# Patient Record
Sex: Female | Born: 1975 | Race: Black or African American | Hispanic: No | Marital: Single | Smoking: Never smoker
Health system: Southern US, Community
[De-identification: ages and names within clinical notes are randomized; demographics above are authoritative.]

## PROBLEM LIST (undated history)

## (undated) DIAGNOSIS — E119 Type 2 diabetes mellitus without complications: Secondary | ICD-10-CM

## (undated) DIAGNOSIS — F32A Depression, unspecified: Secondary | ICD-10-CM

## (undated) DIAGNOSIS — I10 Essential (primary) hypertension: Secondary | ICD-10-CM

## (undated) DIAGNOSIS — E669 Obesity, unspecified: Secondary | ICD-10-CM

## (undated) DIAGNOSIS — F79 Unspecified intellectual disabilities: Secondary | ICD-10-CM

## (undated) DIAGNOSIS — E0500 Thyrotoxicosis with diffuse goiter without thyrotoxic crisis or storm: Secondary | ICD-10-CM

## (undated) DIAGNOSIS — K7581 Nonalcoholic steatohepatitis (NASH): Secondary | ICD-10-CM

## (undated) DIAGNOSIS — J45909 Unspecified asthma, uncomplicated: Secondary | ICD-10-CM

## (undated) HISTORY — DX: Obesity, unspecified: E66.9

## (undated) HISTORY — DX: Type 2 diabetes mellitus without complications: E11.9

## (undated) HISTORY — DX: Nonalcoholic steatohepatitis (NASH): K75.81

## (undated) HISTORY — DX: Unspecified intellectual disabilities: F79

## (undated) HISTORY — DX: Unspecified asthma, uncomplicated: J45.909

## (undated) HISTORY — DX: Essential (primary) hypertension: I10

## (undated) HISTORY — DX: Thyrotoxicosis with diffuse goiter without thyrotoxic crisis or storm: E05.00

## (undated) HISTORY — DX: Depression, unspecified: F32.A

---

## 2015-05-16 DIAGNOSIS — E119 Type 2 diabetes mellitus without complications: Secondary | ICD-10-CM | POA: Insufficient documentation

## 2015-05-16 DIAGNOSIS — I10 Essential (primary) hypertension: Secondary | ICD-10-CM | POA: Insufficient documentation

## 2015-07-24 DIAGNOSIS — K760 Fatty (change of) liver, not elsewhere classified: Secondary | ICD-10-CM | POA: Insufficient documentation

## 2016-06-08 DIAGNOSIS — E7800 Pure hypercholesterolemia, unspecified: Secondary | ICD-10-CM | POA: Insufficient documentation

## 2017-12-06 DIAGNOSIS — E1165 Type 2 diabetes mellitus with hyperglycemia: Secondary | ICD-10-CM | POA: Insufficient documentation

## 2018-08-27 DIAGNOSIS — E0590 Thyrotoxicosis, unspecified without thyrotoxic crisis or storm: Secondary | ICD-10-CM | POA: Insufficient documentation

## 2018-08-31 ENCOUNTER — Other Ambulatory Visit (HOSPITAL_COMMUNITY): Payer: Self-pay | Admitting: Family Medicine

## 2018-08-31 ENCOUNTER — Other Ambulatory Visit: Payer: Self-pay | Admitting: Family Medicine

## 2018-08-31 DIAGNOSIS — E119 Type 2 diabetes mellitus without complications: Secondary | ICD-10-CM

## 2018-08-31 DIAGNOSIS — R109 Unspecified abdominal pain: Secondary | ICD-10-CM

## 2018-09-01 ENCOUNTER — Other Ambulatory Visit: Payer: Self-pay | Admitting: Family Medicine

## 2018-09-01 DIAGNOSIS — R109 Unspecified abdominal pain: Secondary | ICD-10-CM

## 2018-09-01 DIAGNOSIS — E119 Type 2 diabetes mellitus without complications: Secondary | ICD-10-CM

## 2019-12-25 ENCOUNTER — Other Ambulatory Visit: Payer: Self-pay | Admitting: Family Medicine

## 2019-12-25 DIAGNOSIS — Z1231 Encounter for screening mammogram for malignant neoplasm of breast: Secondary | ICD-10-CM

## 2020-01-03 ENCOUNTER — Encounter: Payer: Self-pay | Admitting: Radiology

## 2020-01-03 ENCOUNTER — Ambulatory Visit
Admission: RE | Admit: 2020-01-03 | Discharge: 2020-01-03 | Disposition: A | Discharge status: Home / Self Care | Payer: Medicare Other | Source: Ambulatory Visit | Attending: Family Medicine | Admitting: Family Medicine

## 2020-01-03 DIAGNOSIS — Z1231 Encounter for screening mammogram for malignant neoplasm of breast: Secondary | ICD-10-CM | POA: Diagnosis not present

## 2020-01-30 ENCOUNTER — Emergency Department: Payer: Medicare Other

## 2020-01-30 ENCOUNTER — Encounter: Payer: Self-pay | Admitting: *Deleted

## 2020-01-30 ENCOUNTER — Emergency Department
Admission: EM | Admit: 2020-01-30 | Discharge: 2020-01-30 | Discharge status: Against Medical Advice | Payer: Medicare Other | Attending: Emergency Medicine | Admitting: Emergency Medicine

## 2020-01-30 ENCOUNTER — Other Ambulatory Visit: Payer: Self-pay

## 2020-01-30 DIAGNOSIS — R0789 Other chest pain: Secondary | ICD-10-CM | POA: Insufficient documentation

## 2020-01-30 DIAGNOSIS — Z5321 Procedure and treatment not carried out due to patient leaving prior to being seen by health care provider: Secondary | ICD-10-CM | POA: Diagnosis not present

## 2020-01-30 LAB — CBC
HCT: 41.6 % (ref 36.0–46.0)
Hemoglobin: 13.7 g/dL (ref 12.0–15.0)
MCH: 28.2 pg (ref 26.0–34.0)
MCHC: 32.9 g/dL (ref 30.0–36.0)
MCV: 85.6 fL (ref 80.0–100.0)
Platelets: 352 10*3/uL (ref 150–400)
RBC: 4.86 MIL/uL (ref 3.87–5.11)
RDW: 13.7 % (ref 11.5–15.5)
WBC: 10.5 10*3/uL (ref 4.0–10.5)
nRBC: 0 % (ref 0.0–0.2)

## 2020-01-30 LAB — BASIC METABOLIC PANEL
Anion gap: 7 (ref 5–15)
BUN: 10 mg/dL (ref 6–20)
CO2: 27 mmol/L (ref 22–32)
Calcium: 9.3 mg/dL (ref 8.9–10.3)
Chloride: 103 mmol/L (ref 98–111)
Creatinine, Ser: 0.87 mg/dL (ref 0.44–1.00)
GFR calc Af Amer: >60 mL/min (ref >60)
GFR calc non Af Amer: >60 mL/min (ref >60)
Glucose, Bld: 197 mg/dL — ABNORMAL HIGH (ref 70–99)
Potassium: 4.8 mmol/L (ref 3.5–5.1)
Sodium: 137 mmol/L (ref 135–145)

## 2020-01-30 LAB — TROPONIN I (HIGH SENSITIVITY): Troponin I (High Sensitivity): 3 ng/L (ref <18)

## 2020-01-30 MED ORDER — SODIUM CHLORIDE 0.9% FLUSH: 3.0000 mL | Freq: Once | INTRAVENOUS | Status: DC

## 2020-01-30 NOTE — ED Triage Notes (Signed)
Pt to triage via wheelchair.  Pt has chest pain and sob.  Sx began this am.  Pain in center of chest.  Hx asthma.  Pt alert  Speech clear.

## 2020-05-01 ENCOUNTER — Encounter: Payer: Self-pay | Admitting: General Surgery

## 2020-05-01 ENCOUNTER — Other Ambulatory Visit: Payer: Self-pay | Admitting: General Surgery

## 2020-05-01 ENCOUNTER — Other Ambulatory Visit: Payer: Self-pay

## 2020-05-01 ENCOUNTER — Ambulatory Visit (INDEPENDENT_AMBULATORY_CARE_PROVIDER_SITE_OTHER): Payer: Medicare Other | Admitting: General Surgery

## 2020-05-01 VITALS — BP 159/96 | HR 85 | Temp 99.0°F | Ht 67.0 in | Wt 217.8 lb

## 2020-05-01 DIAGNOSIS — E0500 Thyrotoxicosis with diffuse goiter without thyrotoxic crisis or storm: Secondary | ICD-10-CM | POA: Diagnosis not present

## 2020-05-01 NOTE — Patient Instructions (Addendum)
Our surgery scheduler Pamala Hurry will contact you within the next 24-48 hours. During that call, she will discuss the preparation prior to surgery and discuss the different dates and times to get scheduled. Please have the BLUE sheet available when she contacts you. If you have any questions regarding surgery, please do not hesitate to give our office a call. Dr.Cannon discussed the surgery and risk factors at today's visit with patient and family. Dr.Cannon recommends patient to take Calcium supplements daily for two weeks prior to surgery. Dr.Cannon recommends taking 2 Tums three times a day.  Hyperthyroidism  Hyperthyroidism is when the thyroid gland is too active (overactive). The thyroid gland is a small gland located in the lower front part of the neck, just in front of the windpipe (trachea). This gland makes hormones that help control how the body uses food for energy (metabolism) as well as how the heart and brain function. These hormones also play a role in keeping your bones strong. When the thyroid is overactive, it produces too much of a hormone called thyroxine. What are the causes? This condition may be caused by:  Graves' disease. This is a disorder in which the body's disease-fighting system (immune system) attacks the thyroid gland. This is the most common cause.  Inflammation of the thyroid gland.  A tumor in the thyroid gland.  Use of certain medicines, including: ? Prescription thyroid hormone replacement. ? Herbal supplements that mimic thyroid hormones. ? Amiodarone therapy.  Solid or fluid-filled lumps within your thyroid gland (thyroid nodules).  Taking in a large amount of iodine from foods or medicines. What increases the risk? You are more likely to develop this condition if:  You are female.  You have a family history of thyroid conditions.  You smoke tobacco.  You use a medicine called lithium.  You take medicines that affect the immune system  (immunosuppressants). What are the signs or symptoms? Symptoms of this condition include:  Nervousness.  Inability to tolerate heat.  Unexplained weight loss.  Diarrhea.  Change in the texture of hair or skin.  Heart skipping beats or making extra beats.  Rapid heart rate.  Loss of menstruation.  Shaky hands.  Fatigue.  Restlessness.  Sleep problems.  Enlarged thyroid gland or a lump in the thyroid (nodule). You may also have symptoms of Graves' disease, which may include:  Protruding eyes.  Dry eyes.  Red or swollen eyes.  Problems with vision. How is this diagnosed? This condition may be diagnosed based on:  Your symptoms and medical history.  A physical exam.  Blood tests.  Thyroid ultrasound. This test involves using sound waves to produce images of the thyroid gland.  A thyroid scan. A radioactive substance is injected into a vein, and images show how much iodine is present in the thyroid.  Radioactive iodine uptake test (RAIU). A small amount of radioactive iodine is given by mouth to see how much iodine the thyroid absorbs after a certain amount of time. How is this treated? Treatment depends on the cause and severity of the condition. Treatment may include:  Medicines to reduce the amount of thyroid hormone your body makes.  Radioactive iodine treatment (radioiodine therapy). This involves swallowing a small dose of radioactive iodine, in capsule or liquid form, to kill thyroid cells.  Surgery to remove part or all of your thyroid gland. You may need to take thyroid hormone replacement medicine for the rest of your life after thyroid surgery.  Medicines to help manage your symptoms.  Follow these instructions at home:   Take over-the-counter and prescription medicines only as told by your health care provider.  Do not use any products that contain nicotine or tobacco, such as cigarettes and e-cigarettes. If you need help quitting, ask your  health care provider.  Follow any instructions from your health care provider about diet. You may be instructed to limit foods that contain iodine.  Keep all follow-up visits as told by your health care provider. This is important. ? You will need to have blood tests regularly so that your health care provider can monitor your condition. Contact a health care provider if:  Your symptoms do not get better with treatment.  You have a fever.  You are taking thyroid hormone replacement medicine and you: ? Have symptoms of depression. ? Feel like you are tired all the time. ? Gain weight. Get help right away if:  You have chest pain.  You have decreased alertness or a change in your awareness.  You have abdominal pain.  You feel dizzy.  You have a rapid heartbeat.  You have an irregular heartbeat.  You have difficulty breathing. Summary  The thyroid gland is a small gland located in the lower front part of the neck, just in front of the windpipe (trachea).  Hyperthyroidism is when the thyroid gland is too active (overactive) and produces too much of a hormone called thyroxine.  The most common cause is Graves' disease, a disorder in which your immune system attacks the thyroid gland.  Hyperthyroidism can cause various symptoms, such as unexplained weight loss, nervousness, inability to tolerate heat, or changes in your heartbeat.  Treatment may include medicine to reduce the amount of thyroid hormone your body makes, radioiodine therapy, surgery, or medicines to manage symptoms. This information is not intended to replace advice given to you by your health care provider. Make sure you discuss any questions you have with your health care provider. Document Revised: 07/22/2017 Document Reviewed: 07/20/2017 Elsevier Patient Education  2020 [employer].

## 2020-05-01 NOTE — Progress Notes (Signed)
Patient ID: Jordan Sutton, female   DOB: Oct 30, 1975, 44 y.o.   MRN: [id number]  Chief Complaint  Patient presents with  . New Patient (Initial Visit)    new pt ref Dr.Melissa Solum eval for total thyroidectomy    HPI Jordan Sutton is a 44 y.o. female.   She has been referred by Dr. Gabriel Carina for surgical evaluation of Graves' disease. Dr. Gabriel Carina has followed her since January 2020. Onset of disease was April 2019. She was initiated on methimazole, but she has not been consistently compliant with this medication when she is not staying with her aunt, Jordan Sutton, who helps with her medication management and makes her medical decisions (secondary to intellectual disability). Due to some difficulty with medical compliance, the option of iodine-131 was discussed with the patient and Jordan Sutton. Due to the post ablation sequestration requirements, this is not feasible in their current living situation. As result, surgery has been recommended as the best means of managing Jordan Sutton's Graves' disease.  Ms. Jordan Sutton denies any anxiousness, jitteriness, or hand tremors. She does endorse heart palpitations. She denies any changes in the texture of her hair, skin, or fingernails. No heat or cold intolerance. She denies hyper defecation. No significant fatigue or excess energy. No voice changes or dysphagia. She does endorse frequent throat clearing. She reports photosensitivity, a gritty sensation in her eyes, and frequent watering. Jordan Sutton reports that when she was first diagnosed, Jordan Sutton was losing weight, but has since gained over 60 pounds. Based on Dr. Joycie Sutton notes, this appears to be primarily secondary to poor dietary habits. Current methimazole dose is 20 mg twice daily. Her most recent labs (April 16, 2020) show a TSH that is undetectable, a free T4 of 1.18 ng/dL. Alkaline phosphatase is mildly elevated, but AST and ALT are within normal limits. Her hemogram appears normal.   Past Medical History:  Diagnosis Date  .  Asthma   . Depression   . Diabetes mellitus without complication ([place])   . Graves' disease with exophthalmos   . Hypertension   . Intellectual disability   . NASH (nonalcoholic steatohepatitis)   . Obesity     History reviewed. No pertinent surgical history.  Patient states she has never had surgery.  Family History  Problem Relation Age of Onset  . Alcohol abuse Mother   . Hypertension Father   No known family history of Graves' disease or other autoimmune disorders/thyroid problems.  Social History Social History   Tobacco Use  . Smoking status: Never Smoker  . Smokeless tobacco: Never Used  Substance Use Topics  . Alcohol use: Not Currently  . Drug use: Not on file    No Known Allergies  Current Outpatient Medications  Medication Sig Dispense Refill  . albuterol (VENTOLIN HFA) 108 (90 Base) MCG/ACT inhaler Inhale into the lungs.    Marland Kitchen amphetamine-dextroamphetamine (ADDERALL XR) 20 MG 24 hr capsule     . Blood Glucose Monitoring Suppl (FIFTY50 GLUCOSE METER 2.0) w/Device KIT Use as directed E11.9    . Cysteamine Bitartrate (PROCYSBI) 300 MG PACK Use 1 each once daily    . gabapentin (NEURONTIN) 100 MG capsule     . glipiZIDE (GLUCOTROL XL) 5 MG 24 hr tablet Take by mouth.    Marland Kitchen glucose blood (PRECISION QID TEST) test strip Use 1 each (1 strip total) once daily Use as instructed. E11.9    . haloperidol (HALDOL) 2 MG tablet Take by mouth.    Marland Kitchen ipratropium (ATROVENT HFA) 17 MCG/ACT inhaler Inhale into the lungs.    Marland Kitchen  metFORMIN (GLUCOPHAGE) 1000 MG tablet Take 1 tablet (1,000 mg total) by mouth 2 (two) times daily with meals    . methimazole (TAPAZOLE) 10 MG tablet     . sertraline (ZOLOFT) 100 MG tablet Take by mouth.    . simvastatin (ZOCOR) 40 MG tablet     . Spacer/Aero-Holding Chambers (EASIVENT) inhaler See admin instructions.    . topiramate (TOPAMAX) 50 MG tablet Take by mouth.    Marland Kitchen VYVANSE 30 MG capsule      No current facility-administered medications for this  visit.    Review of Systems Review of Systems  Eyes: Positive for photophobia, pain, discharge, redness and itching.  Cardiovascular: Positive for palpitations.  Skin:       Pruritus  All other systems reviewed and are negative. Or as discussed in the history of present illness.  Blood pressure (!) 159/96, pulse 85, temperature 99 F (37.2 C), temperature source Oral, height 5' 7"  (1.702 m), weight 217 lb 12.8 oz (98.8 kg), SpO2 98 %. Body mass index is 34.11 kg/m.  Physical Exam Physical Exam Constitutional:      General: She is not in acute distress.    Appearance: She is obese.  HENT:     Head: Normocephalic and atraumatic.     Nose:     Comments: Covered with a mask    Mouth/Throat:     Comments: Covered with a mask Eyes:     General: No scleral icterus.    Comments: Proptosis and lid lag are present. She has significant periorbital edema.  Neck:     Comments: The thyroid is diffusely enlarged. It is nontender. No discrete masses are palpated. The trachea is midline. The thyroid gland moves freely with deglutition. No thyroid bruit is identified. No palpable cervical or supraclavicular lymphadenopathy. Cardiovascular:     Rate and Rhythm: Normal rate and regular rhythm.     Pulses: Normal pulses.  Pulmonary:     Effort: Pulmonary effort is normal. No respiratory distress.     Breath sounds: Wheezing present. No rhonchi.  Abdominal:     General: Bowel sounds are normal.     Palpations: Abdomen is soft.  Genitourinary:    Comments: Deferred Musculoskeletal:        General: No deformity or signs of injury.     Right lower leg: No edema.     Left lower leg: No edema.     Comments: No pretibial dermopathy.  Skin:    General: Skin is warm and dry.  Neurological:     General: No focal deficit present.     Mental Status: She is alert. Mental status is at baseline.  Psychiatric:        Mood and Affect: Mood normal.        Behavior: Behavior normal.     Data  Reviewed As discussed in the history of present illness, I have reviewed Dr. Joycie Sutton clinic notes regarding this patient. These include the most recent thyroid function tests as reported above.  There are no relevant imaging studies for review.  Assessment This is a 44 year old woman with intellectual disability and Graves' disease. She has had some difficulty maintaining medical compliance with her methimazole. She and her aunt, with whom she lives and who makes her medical decisions for her, wish to pursue more permanent treatment of her hyperthyroidism. Due to their living situation, post ablative sequestration is not possible and therefore they wish to proceed with total thyroidectomy.  Plan The risks of thyroid  surgery were discussed, including (but not limited to): bleeding, infection, damage to surrounding structures/tissues, injury (temporary or permanent) to the recurrent laryngeal nerve, hypoparathyroidism (temporary or permanent), need for thyroid hormone replacement therapy, need for additional surgery and/or treatment, recurrence of disease, tracheostomy (temporary or permanent).  The patient and her aunt had the opportunity to ask any questions and these were answered to their satisfaction.  At this time, her current free T4 is within a safe and acceptable range for surgical intervention. They were encouraged to continue strict adherence to medical compliance to make sure that we do not run into issues with symptomatic hyperthyroidism or thyroid storm around the time of her surgery. 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 This has been demonstrated to be ameliorated to a significant degree by preoperative calcium supplementation starting 10 days to 2 weeks prior to surgery. We will have the patient take 2 Tums 3 times daily starting 2 weeks prior to her operation. I do not think she requires any SSKI or other preemptory medications. We will work  on getting her scheduled.    Fredirick Maudlin 05/01/2020, 1:11 PM

## 2020-05-05 ENCOUNTER — Telehealth: Payer: Self-pay | Admitting: General Surgery

## 2020-05-05 NOTE — Telephone Encounter (Signed)
Spoke with aunt, [name] (caretaker for [facility]).  She has been advised of Pre-Admission date/time, COVID Testing date and Surgery date.  Surgery Date: 06/23/20 Preadmission Testing Date: 06/16/20 (phone 8a-1p) Covid Testing Date: 06/19/20 - patient advised to go to the [facility] between 8a-1p   They also have been made aware to call [phone], between 1-3:00pm the day before surgery, to find out what time to arrive for surgery.

## 2020-05-06 ENCOUNTER — Inpatient Hospital Stay: Admission: RE | Admit: 2020-05-06 | Payer: Medicare Other | Source: Ambulatory Visit

## 2020-05-08 ENCOUNTER — Other Ambulatory Visit: Payer: Medicare Other

## 2020-06-16 ENCOUNTER — Other Ambulatory Visit: Payer: Self-pay

## 2020-06-16 ENCOUNTER — Encounter
Admission: RE | Admit: 2020-06-16 | Discharge: 2020-06-16 | Disposition: A | Discharge status: Home / Self Care | Payer: Medicare Other | Source: Ambulatory Visit | Attending: General Surgery | Admitting: General Surgery

## 2020-06-16 NOTE — Patient Instructions (Addendum)
Your procedure is scheduled on: 06/23/20 Report to [facility]. To find out your arrival time please call [phone] between 1PM - 3PM on 06/20/20.  Remember: Instructions that are not followed completely may result in serious medical risk, up to and including death, or upon the discretion of your surgeon and anesthesiologist your surgery may need to be rescheduled.     _X__ 1. Do not eat food after midnight the night before your procedure.                 No gum chewing or hard candies. You may drink clear liquids up to 2 hours                 before you are scheduled to arrive for your surgery- DO not drink clear                 liquids within 2 hours of the start of your surgery.                 Clear Liquids include:  water, apple juice without pulp, clear carbohydrate                 drink such as Clearfast or Gatorade, Black Coffee or Tea (Do not add                 anything to coffee or tea). Diabetics water only  __X__2.  On the morning of surgery brush your teeth with toothpaste and water, you                 may rinse your mouth with mouthwash if you wish.  Do not swallow any              toothpaste of mouthwash.     _X__ 3.  No Alcohol for 24 hours before or after surgery.   _X__ 4.  Do Not Smoke or use e-cigarettes For 24 Hours Prior to Your Surgery.                 Do not use any chewable tobacco products for at least 6 hours prior to                 surgery.  ____  5.  Bring all medications with you on the day of surgery if instructed.   __X__  6.  Notify your doctor if there is any change in your medical condition      (cold, fever, infections).     Do not wear jewelry, make-up, hairpins, clips or nail polish. Do not wear lotions, powders, or perfumes.  Do not shave 48 hours prior to surgery. Men may shave face and neck. Do not bring valuables to the hospital.    [facility] is not responsible for any belongings  or valuables.  Contacts, dentures/partials or body piercings may not be worn into surgery. Bring a case for your contacts, glasses or hearing aids, a denture cup will be supplied. Leave your suitcase in the car. After surgery it may be brought to your room. For patients admitted to the hospital, discharge time is determined by your treatment team.   Patients discharged the day of surgery will not be allowed to drive home.   Please read over the following fact sheets that you were given:   MRSA Information  __X__ Take these medicines the morning of surgery with A SIP OF WATER:  1. haloperidol (HALDOL) 2 MG tablet  2. methimazole (TAPAZOLE) 10 MG tablet  3. sertraline (ZOLOFT) 100 MG tablet  4. simvastatin (ZOCOR) 40 MG tablet  5. topiramate (TOPAMAX) 50 MG tablet  6. VYVANSE 50 MG capsule  ____ Fleet Enema (as directed)   __X__ Use CHG Soap/SAGE wipes as directed  __X__ Use inhalers on the day of surgery  __X__ Stop metformin/Janumet/Farxiga 2 days prior to surgery    ____ Take 1/2 of usual insulin dose the night before surgery. No insulin the morning          of surgery.   ____ Stop Blood Thinners Coumadin/Plavix/Xarelto/Pleta/Pradaxa/Eliquis/Effient/Aspirin  on   Or contact your Surgeon, Cardiologist or Medical Doctor regarding  ability to stop your blood thinners  __X__ Stop Anti-inflammatories 7 days before surgery such as Advil, Ibuprofen, Motrin,  BC or Goodies Powder, Naprosyn, Naproxen, Aleve, Aspirin    __X__ Stop all herbal supplements, fish oil or vitamin E until after surgery.    ____ Bring C-Pap to the hospital.

## 2020-06-19 ENCOUNTER — Other Ambulatory Visit
Admission: RE | Admit: 2020-06-19 | Discharge: 2020-06-19 | Disposition: A | Discharge status: Home / Self Care | Payer: Medicare Other | Source: Ambulatory Visit | Attending: General Surgery | Admitting: General Surgery

## 2020-06-19 ENCOUNTER — Other Ambulatory Visit: Payer: Medicare Other

## 2020-06-19 ENCOUNTER — Other Ambulatory Visit: Payer: Self-pay

## 2020-06-19 DIAGNOSIS — Z20822 Contact with and (suspected) exposure to covid-19: Secondary | ICD-10-CM | POA: Diagnosis not present

## 2020-06-19 DIAGNOSIS — Z01812 Encounter for preprocedural laboratory examination: Secondary | ICD-10-CM | POA: Diagnosis present

## 2020-06-19 LAB — BASIC METABOLIC PANEL
Anion gap: 8 (ref 5–15)
BUN: 9 mg/dL (ref 6–20)
CO2: 26 mmol/L (ref 22–32)
Calcium: 9.5 mg/dL (ref 8.9–10.3)
Chloride: 104 mmol/L (ref 98–111)
Creatinine, Ser: 0.74 mg/dL (ref 0.44–1.00)
GFR, Estimated: >60 mL/min (ref >60)
Glucose, Bld: 148 mg/dL — ABNORMAL HIGH (ref 70–99)
Potassium: 4.2 mmol/L (ref 3.5–5.1)
Sodium: 138 mmol/L (ref 135–145)

## 2020-06-19 LAB — SARS CORONAVIRUS 2 (TAT 6-24 HRS): SARS Coronavirus 2: NEGATIVE

## 2020-06-23 ENCOUNTER — Other Ambulatory Visit: Payer: Self-pay

## 2020-06-23 ENCOUNTER — Encounter
Admission: RE | Disposition: A | Discharge status: Home / Self Care | Payer: Self-pay | Source: Home / Self Care | Attending: General Surgery

## 2020-06-23 ENCOUNTER — Encounter: Payer: Self-pay | Admitting: General Surgery

## 2020-06-23 ENCOUNTER — Observation Stay
Admission: RE | Admit: 2020-06-23 | Discharge: 2020-06-24 | Disposition: A | Discharge status: Home / Self Care | Payer: Medicare Other | Attending: General Surgery | Admitting: General Surgery

## 2020-06-23 ENCOUNTER — Ambulatory Visit: Payer: Medicare Other

## 2020-06-23 DIAGNOSIS — Z79899 Other long term (current) drug therapy: Secondary | ICD-10-CM | POA: Insufficient documentation

## 2020-06-23 DIAGNOSIS — E890 Postprocedural hypothyroidism: Secondary | ICD-10-CM

## 2020-06-23 DIAGNOSIS — Z7984 Long term (current) use of oral hypoglycemic drugs: Secondary | ICD-10-CM | POA: Diagnosis not present

## 2020-06-23 DIAGNOSIS — Z9889 Other specified postprocedural states: Secondary | ICD-10-CM

## 2020-06-23 DIAGNOSIS — E0500 Thyrotoxicosis with diffuse goiter without thyrotoxic crisis or storm: Principal | ICD-10-CM

## 2020-06-23 DIAGNOSIS — I10 Essential (primary) hypertension: Secondary | ICD-10-CM | POA: Diagnosis not present

## 2020-06-23 DIAGNOSIS — J45909 Unspecified asthma, uncomplicated: Secondary | ICD-10-CM | POA: Diagnosis not present

## 2020-06-23 DIAGNOSIS — E119 Type 2 diabetes mellitus without complications: Secondary | ICD-10-CM | POA: Insufficient documentation

## 2020-06-23 HISTORY — PX: THYROIDECTOMY: SHX17

## 2020-06-23 LAB — GLUCOSE, CAPILLARY
Glucose-Capillary: 202 mg/dL — ABNORMAL HIGH (ref 70–99)
Glucose-Capillary: 188 mg/dL — ABNORMAL HIGH (ref 70–99)

## 2020-06-23 LAB — ALBUMIN: Albumin: 3.7 g/dL (ref 3.5–5.0)

## 2020-06-23 LAB — POCT PREGNANCY, URINE: Preg Test, Ur: NEGATIVE

## 2020-06-23 LAB — CALCIUM: Calcium: 8.7 mg/dL — ABNORMAL LOW (ref 8.9–10.3)

## 2020-06-23 SURGERY — THYROIDECTOMY
Anesthesia: General | Site: Neck

## 2020-06-23 MED ORDER — GABAPENTIN 100 MG PO CAPS
100.0000 mg | ORAL_CAPSULE | Freq: Every day | ORAL | Status: DC
  Administered 2020-06-23: 100 mg via ORAL

## 2020-06-23 MED ORDER — GLIPIZIDE ER 5 MG PO TB24
5.0000 mg | ORAL_TABLET | Freq: Every day | ORAL | Status: DC
  Administered 2020-06-24: 5 mg via ORAL
  Filled 2020-06-23 (×2): qty 1

## 2020-06-23 MED ORDER — ORAL CARE MOUTH RINSE: 15.0000 mL | Freq: Once | OROMUCOSAL | Status: AC

## 2020-06-23 MED ORDER — HEMOSTATIC AGENTS (NO CHARGE) OPTIME
TOPICAL | Status: DC | PRN
  Administered 2020-06-23: 1 via TOPICAL

## 2020-06-23 MED ORDER — DIPHENHYDRAMINE HCL 50 MG/ML IJ SOLN
INTRAMUSCULAR | Status: AC
  Filled 2020-06-23: qty 1

## 2020-06-23 MED ORDER — ALBUTEROL SULFATE HFA 108 (90 BASE) MCG/ACT IN AERS
1.0000 | INHALATION_SPRAY | Freq: Four times a day (QID) | RESPIRATORY_TRACT | Status: DC | PRN
  Filled 2020-06-23: qty 6.7

## 2020-06-23 MED ORDER — ACETAMINOPHEN 500 MG PO TABS
ORAL_TABLET | ORAL | Status: AC
  Administered 2020-06-23: 1000 mg via ORAL
  Filled 2020-06-23: qty 2

## 2020-06-23 MED ORDER — PROPOFOL 10 MG/ML IV BOLUS
INTRAVENOUS | Status: AC
  Filled 2020-06-23: qty 20

## 2020-06-23 MED ORDER — IPRATROPIUM BROMIDE HFA 17 MCG/ACT IN AERS
1.0000 | INHALATION_SPRAY | Freq: Four times a day (QID) | RESPIRATORY_TRACT | Status: DC | PRN
  Filled 2020-06-23: qty 12.9

## 2020-06-23 MED ORDER — ONDANSETRON HCL 4 MG/2ML IJ SOLN
INTRAMUSCULAR | Status: AC
  Filled 2020-06-23: qty 2

## 2020-06-23 MED ORDER — SODIUM CHLORIDE 0.9 % IV SOLN
INTRAVENOUS | Status: DC | PRN
  Administered 2020-06-23: 20 ug/min via INTRAVENOUS

## 2020-06-23 MED ORDER — "VISTASEAL 4 ML SINGLE DOSE KIT "
PACK | CUTANEOUS | Status: DC | PRN
  Administered 2020-06-23: 4 mL via TOPICAL

## 2020-06-23 MED ORDER — OXYCODONE HCL 5 MG/5ML PO SOLN: 5.0000 mg | Freq: Once | ORAL | Status: DC | PRN

## 2020-06-23 MED ORDER — AMPHETAMINE-DEXTROAMPHET ER 20 MG PO CP24: 20.0000 mg | ORAL_CAPSULE | Freq: Every day | ORAL | Status: DC

## 2020-06-23 MED ORDER — MIDAZOLAM HCL 2 MG/2ML IJ SOLN
INTRAMUSCULAR | Status: DC | PRN
  Administered 2020-06-23 (×2): 1 mg via INTRAVENOUS

## 2020-06-23 MED ORDER — GABAPENTIN 300 MG PO CAPS
ORAL_CAPSULE | ORAL | Status: AC
  Administered 2020-06-23: 300 mg via ORAL
  Filled 2020-06-23: qty 1

## 2020-06-23 MED ORDER — PROPOFOL 10 MG/ML IV BOLUS
INTRAVENOUS | Status: DC | PRN
  Administered 2020-06-23: 150 ug/kg/min via INTRAVENOUS

## 2020-06-23 MED ORDER — SODIUM CHLORIDE 0.9 % IV SOLN: INTRAVENOUS | Status: DC

## 2020-06-23 MED ORDER — PHENYLEPHRINE HCL (PRESSORS) 10 MG/ML IV SOLN
INTRAVENOUS | Status: DC | PRN
  Administered 2020-06-23: 100 ug via INTRAVENOUS

## 2020-06-23 MED ORDER — HALOPERIDOL 2 MG PO TABS
2.0000 mg | ORAL_TABLET | Freq: Every morning | ORAL | Status: DC
  Administered 2020-06-24: 2 mg via ORAL
  Filled 2020-06-23: qty 1

## 2020-06-23 MED ORDER — DEXAMETHASONE SODIUM PHOSPHATE 10 MG/ML IJ SOLN
INTRAMUSCULAR | Status: AC
  Filled 2020-06-23: qty 1

## 2020-06-23 MED ORDER — OXYCODONE HCL 5 MG PO TABS: 5.0000 mg | ORAL_TABLET | Freq: Once | ORAL | Status: DC | PRN

## 2020-06-23 MED ORDER — GABAPENTIN 300 MG PO CAPS: 300.0000 mg | ORAL_CAPSULE | ORAL | Status: AC

## 2020-06-23 MED ORDER — CHLORHEXIDINE GLUCONATE CLOTH 2 % EX PADS: 6.0000 | MEDICATED_PAD | Freq: Once | CUTANEOUS | Status: DC

## 2020-06-23 MED ORDER — LEVOTHYROXINE SODIUM 150 MCG PO TABS
150.0000 ug | ORAL_TABLET | Freq: Every day | ORAL | Status: DC
  Administered 2020-06-24: 150 ug via ORAL
  Filled 2020-06-23: qty 1

## 2020-06-23 MED ORDER — LIDOCAINE HCL (PF) 2 % IJ SOLN
INTRAMUSCULAR | Status: AC
  Filled 2020-06-23: qty 5

## 2020-06-23 MED ORDER — DEXMEDETOMIDINE (PRECEDEX) IN NS 20 MCG/5ML (4 MCG/ML) IV SYRINGE
PREFILLED_SYRINGE | INTRAVENOUS | Status: DC | PRN
  Administered 2020-06-23 (×5): 4 ug via INTRAVENOUS

## 2020-06-23 MED ORDER — TOPIRAMATE 25 MG PO TABS
50.0000 mg | ORAL_TABLET | Freq: Every day | ORAL | Status: DC
  Administered 2020-06-23 – 2020-06-24 (×2): 50 mg via ORAL
  Filled 2020-06-23 (×2): qty 2

## 2020-06-23 MED ORDER — MIDAZOLAM HCL 2 MG/2ML IJ SOLN
INTRAMUSCULAR | Status: AC
  Filled 2020-06-23: qty 2

## 2020-06-23 MED ORDER — SUCCINYLCHOLINE CHLORIDE 20 MG/ML IJ SOLN
INTRAMUSCULAR | Status: DC | PRN
  Administered 2020-06-23: 120 mg via INTRAVENOUS

## 2020-06-23 MED ORDER — ACETAMINOPHEN 10 MG/ML IV SOLN
INTRAVENOUS | Status: AC
  Filled 2020-06-23: qty 100

## 2020-06-23 MED ORDER — IBUPROFEN 600 MG PO TABS
600.0000 mg | ORAL_TABLET | Freq: Four times a day (QID) | ORAL | Status: DC | PRN
  Filled 2020-06-23: qty 1

## 2020-06-23 MED ORDER — CALCIUM CARBONATE 1250 (500 CA) MG PO TABS
1000.0000 mg | ORAL_TABLET | Freq: Three times a day (TID) | ORAL | Status: DC
  Administered 2020-06-23 – 2020-06-24 (×2): 1000 mg via ORAL
  Filled 2020-06-23 (×4): qty 2

## 2020-06-23 MED ORDER — DEXAMETHASONE SODIUM PHOSPHATE 10 MG/ML IJ SOLN
INTRAMUSCULAR | Status: DC | PRN
  Administered 2020-06-23: 10 mg via INTRAVENOUS

## 2020-06-23 MED ORDER — LISDEXAMFETAMINE DIMESYLATE 50 MG PO CAPS
50.0000 mg | ORAL_CAPSULE | Freq: Every day | ORAL | Status: DC
  Filled 2020-06-23: qty 1

## 2020-06-23 MED ORDER — PROPOFOL 500 MG/50ML IV EMUL: INTRAVENOUS | Status: DC | PRN

## 2020-06-23 MED ORDER — ACETAMINOPHEN 500 MG PO TABS
1000.0000 mg | ORAL_TABLET | Freq: Four times a day (QID) | ORAL | Status: DC
  Administered 2020-06-24: 1000 mg via ORAL

## 2020-06-23 MED ORDER — SIMVASTATIN 40 MG PO TABS
40.0000 mg | ORAL_TABLET | Freq: Every day | ORAL | Status: DC
  Administered 2020-06-23: 40 mg via ORAL
  Filled 2020-06-23 (×2): qty 1

## 2020-06-23 MED ORDER — FENTANYL CITRATE (PF) 100 MCG/2ML IJ SOLN
INTRAMUSCULAR | Status: AC
  Filled 2020-06-23: qty 2

## 2020-06-23 MED ORDER — ONDANSETRON HCL 4 MG/2ML IJ SOLN: 4.0000 mg | Freq: Four times a day (QID) | INTRAMUSCULAR | Status: DC | PRN

## 2020-06-23 MED ORDER — ACETAMINOPHEN 500 MG PO TABS: 1000.0000 mg | ORAL_TABLET | ORAL | Status: AC

## 2020-06-23 MED ORDER — GABAPENTIN 100 MG PO CAPS
ORAL_CAPSULE | ORAL | Status: AC
  Filled 2020-06-23: qty 1

## 2020-06-23 MED ORDER — METFORMIN HCL 500 MG PO TABS
1000.0000 mg | ORAL_TABLET | Freq: Two times a day (BID) | ORAL | Status: DC
  Administered 2020-06-23 – 2020-06-24 (×2): 1000 mg via ORAL
  Filled 2020-06-23 (×4): qty 2

## 2020-06-23 MED ORDER — REMIFENTANIL HCL 1 MG IV SOLR
INTRAVENOUS | Status: DC | PRN
Start: 2020-06-23 — End: 2020-06-23
  Administered 2020-06-23: .1 ug/kg/min via INTRAVENOUS

## 2020-06-23 MED ORDER — PROPOFOL 500 MG/50ML IV EMUL
INTRAVENOUS | Status: AC
  Filled 2020-06-23: qty 50

## 2020-06-23 MED ORDER — PROPOFOL 500 MG/50ML IV EMUL
INTRAVENOUS | Status: DC | PRN
  Administered 2020-06-23 (×2): 50 mg via INTRAVENOUS
  Administered 2020-06-23: 40 mg via INTRAVENOUS
  Administered 2020-06-23: 150 mg via INTRAVENOUS

## 2020-06-23 MED ORDER — MENTHOL 3 MG MT LOZG
1.0000 | LOZENGE | OROMUCOSAL | Status: DC | PRN
  Filled 2020-06-23: qty 9

## 2020-06-23 MED ORDER — REMIFENTANIL HCL 1 MG IV SOLR
INTRAVENOUS | Status: AC
  Filled 2020-06-23: qty 1000

## 2020-06-23 MED ORDER — CELECOXIB 200 MG PO CAPS: 200.0000 mg | ORAL_CAPSULE | ORAL | Status: AC

## 2020-06-23 MED ORDER — LIDOCAINE HCL (CARDIAC) PF 100 MG/5ML IV SOSY
PREFILLED_SYRINGE | INTRAVENOUS | Status: DC | PRN
  Administered 2020-06-23: 100 mg via INTRAVENOUS

## 2020-06-23 MED ORDER — ONDANSETRON 4 MG PO TBDP: 4.0000 mg | ORAL_TABLET | Freq: Four times a day (QID) | ORAL | Status: DC | PRN

## 2020-06-23 MED ORDER — ONDANSETRON HCL 4 MG/2ML IJ SOLN: 4.0000 mg | Freq: Once | INTRAMUSCULAR | Status: DC | PRN

## 2020-06-23 MED ORDER — CHLORHEXIDINE GLUCONATE 0.12 % MT SOLN: 15.0000 mL | Freq: Once | OROMUCOSAL | Status: AC

## 2020-06-23 MED ORDER — CELECOXIB 200 MG PO CAPS
ORAL_CAPSULE | ORAL | Status: AC
  Administered 2020-06-23: 200 mg via ORAL
  Filled 2020-06-23: qty 1

## 2020-06-23 MED ORDER — PROPOFOL 10 MG/ML IV BOLUS
INTRAVENOUS | Status: AC
  Filled 2020-06-23: qty 40

## 2020-06-23 MED ORDER — SERTRALINE HCL 100 MG PO TABS
100.0000 mg | ORAL_TABLET | Freq: Every day | ORAL | Status: DC
  Administered 2020-06-23 – 2020-06-24 (×2): 100 mg via ORAL
  Filled 2020-06-23 (×2): qty 1

## 2020-06-23 MED ORDER — FAMOTIDINE 20 MG PO TABS: 20.0000 mg | ORAL_TABLET | Freq: Once | ORAL | Status: AC

## 2020-06-23 MED ORDER — CHLORHEXIDINE GLUCONATE 0.12 % MT SOLN
OROMUCOSAL | Status: AC
  Administered 2020-06-23: 15 mL via OROMUCOSAL
  Filled 2020-06-23: qty 15

## 2020-06-23 MED ORDER — FENTANYL CITRATE (PF) 100 MCG/2ML IJ SOLN: 25.0000 ug | INTRAMUSCULAR | Status: DC | PRN

## 2020-06-23 MED ORDER — OXYCODONE HCL 5 MG PO TABS
5.0000 mg | ORAL_TABLET | ORAL | Status: DC | PRN
  Filled 2020-06-23: qty 2

## 2020-06-23 MED ORDER — ONDANSETRON HCL 4 MG/2ML IJ SOLN
INTRAMUSCULAR | Status: DC | PRN
  Administered 2020-06-23: 4 mg via INTRAVENOUS

## 2020-06-23 MED ORDER — SODIUM CHLORIDE (PF) 0.9 % IJ SOLN
INTRAMUSCULAR | Status: AC
  Filled 2020-06-23: qty 40

## 2020-06-23 MED ORDER — TRAMADOL HCL 50 MG PO TABS: 50.0000 mg | ORAL_TABLET | Freq: Four times a day (QID) | ORAL | Status: DC | PRN

## 2020-06-23 MED ORDER — DIPHENHYDRAMINE HCL 50 MG/ML IJ SOLN
INTRAMUSCULAR | Status: DC | PRN
  Administered 2020-06-23: 12.5 mg via INTRAVENOUS

## 2020-06-23 MED ORDER — ACETAMINOPHEN 10 MG/ML IV SOLN: 1000.0000 mg | Freq: Once | INTRAVENOUS | Status: DC | PRN

## 2020-06-23 MED ORDER — FAMOTIDINE 20 MG PO TABS
ORAL_TABLET | ORAL | Status: AC
  Administered 2020-06-23: 20 mg via ORAL
  Filled 2020-06-23: qty 1

## 2020-06-23 MED ORDER — FENTANYL CITRATE (PF) 100 MCG/2ML IJ SOLN
INTRAMUSCULAR | Status: DC | PRN
  Administered 2020-06-23 (×2): 100 ug via INTRAVENOUS

## 2020-06-23 SURGICAL SUPPLY — 45 items
BACTOSHIELD CHG 4% 4OZ (MISCELLANEOUS) ×1
BASIN GRAD PLASTIC 32OZ STRL (MISCELLANEOUS) ×2 IMPLANT
BLADE SURG 15 STRL LF DISP TIS (BLADE) ×1 IMPLANT
BLADE SURG 15 STRL SS (BLADE) ×1
CANISTER SUCT 1200ML W/VALVE (MISCELLANEOUS) IMPLANT
CLIP VESOCCLUDE SM WIDE 6/CT (CLIP) ×2 IMPLANT
COVER WAND RF STERILE (DRAPES) ×2 IMPLANT
DERMABOND ADVANCED (GAUZE/BANDAGES/DRESSINGS) ×1
DERMABOND ADVANCED .7 DNX12 (GAUZE/BANDAGES/DRESSINGS) ×1 IMPLANT
DRAPE MAG INST 16X20 L/F (DRAPES) ×2 IMPLANT
DRAPE THYROID T SHEET (DRAPES) ×2 IMPLANT
ELECT CAUTERY BLADE TIP 2.5 (TIP) ×2
ELECT LARYNGEAL DUAL CHAN (ELECTRODE) ×2 IMPLANT
ELECT NEEDLE 20X.3 GREEN (MISCELLANEOUS) ×2
ELECT REM PT RETURN 9FT ADLT (ELECTROSURGICAL) ×2
ELECTRODE CAUTERY BLDE TIP 2.5 (TIP) ×1 IMPLANT
ELECTRODE NEEDLE 20X.3 GREEN (MISCELLANEOUS) ×1 IMPLANT
ELECTRODE REM PT RTRN 9FT ADLT (ELECTROSURGICAL) ×1 IMPLANT
GAUZE 4X4 16PLY RFD (DISPOSABLE) ×2 IMPLANT
GLOVE BIO SURGEON STRL SZ 6.5 (GLOVE) ×4 IMPLANT
GLOVE INDICATOR 7.0 STRL GRN (GLOVE) ×2 IMPLANT
GOWN STRL REUS W/ TWL LRG LVL3 (GOWN DISPOSABLE) ×2 IMPLANT
GOWN STRL REUS W/TWL LRG LVL3 (GOWN DISPOSABLE) ×2
HEMOSTAT SNOW SURGICEL 2X4 (HEMOSTASIS) ×2 IMPLANT
KIT TURNOVER KIT A (KITS) ×2 IMPLANT
LABEL OR SOLS (LABEL) ×2 IMPLANT
MANIFOLD NEPTUNE II (INSTRUMENTS) ×2 IMPLANT
NERVE STIMULATOR WAVEFORM 16S (NEUROSURGERY SUPPLIES) ×2
NS IRRIG 500ML POUR BTL (IV SOLUTION) ×4 IMPLANT
PACK BASIN MINOR (MISCELLANEOUS) ×2 IMPLANT
PROBE NEUROSIGN BIPOL (MISCELLANEOUS) IMPLANT
PROBE NEUROSIGN BIPOLAR (MISCELLANEOUS)
SCRUB CHG 4% DYNA-HEX 4OZ (MISCELLANEOUS) ×1 IMPLANT
SET WALTER ACTIVATION W/DRAPE (SET/KITS/TRAYS/PACK) ×2 IMPLANT
SHEARS HARMONIC 9CM CVD (BLADE) ×2 IMPLANT
SPONGE KITTNER 5P (MISCELLANEOUS) ×2 IMPLANT
STIMULATOR NERVE WAVEFORM 16S (NEUROSURGERY SUPPLIES) ×1 IMPLANT
STRIP CLOSURE SKIN 1/2X4 (GAUZE/BANDAGES/DRESSINGS) ×2 IMPLANT
SUT MNCRL AB 4-0 PS2 18 (SUTURE) IMPLANT
SUT PROLENE 4 0 PS 2 18 (SUTURE) ×2 IMPLANT
SUT SILK 2 0 (SUTURE) ×4
SUT SILK 2-0 18XBRD TIE 12 (SUTURE) ×4 IMPLANT
SUT VIC AB 4-0 RB1 27 (SUTURE) ×1
SUT VIC AB 4-0 RB1 27X BRD (SUTURE) ×1 IMPLANT
SYR BULB IRRIG 60ML STRL (SYRINGE) ×2 IMPLANT

## 2020-06-23 NOTE — Op Note (Signed)
Operative Note  Preoperative Diagnosis:  Graves' disease  Postoperative Diagnosis:  Graves' disease  Operation:  Total Thyroidectomy  Surgeon: Fredirick Maudlin, MD  Assistant: Nestor Lewandowsky, MD (a second surgeon was necessary due to the technical complexity of the case)  Anesthesia: GETA with nerve monitoring  Findings: The thyroid was diffusely enlarged and hypervascular, consistent with the history of Graves' disease. Both recurrent laryngeal nerves were well visualized and gave excellent functional signals throughout the operation. Both superior parathyroid glands were identified and preserved on good vascular pedicles. I did not clearly appreciate the left inferior gland, but the right inferior gland was also identified and preserved.  Indications: This is a 44 year old woman with Graves' disease. She has been managed medically, but has some difficulty adhering to medication regimen secondary to developmental delay. Due to her living situation, radioactive iodine ablation was not an option for definitive treatment and therefore total thyroidectomy was recommended. The risks of the operation were discussed with the patient as well as her legal guardian. Excepted these risks and wished to proceed with surgery.  Procedure In Detail: The patient was identified in the preoperative holding area and brought to the operating room where she was placed supine on the OR table. All bony prominences were padded and bilateral sequential compression devices were placed on the lower extremities. General endotracheal anesthesia was induced using the nerve monitoring system. Tube placement was verified with the McGrath laryngoscope. The grounding lead was placed. The patient was positioned appropriately for the operation and then sterilely prepped and draped in standard fashion. A timeout was performed confirming the patient's identity, the procedure being performed, her allergies, all necessary equipment was  available, and that maintenance anesthesia was adequate.  A 6 cm transverse incision was made midway between the sternal notch and thyroid cartilage. This was carried down through the subcutaneous tissues and platysma using electrocautery. Subplatysmal flaps were elevated and the strap muscles were divided in the median raphae. The strap muscles were elevated off of the left lobe of the thyroid and retracted laterally. We sequentially isolated and divided the superior pole vessels with silk ties and the harmonic scalpel. The superior parathyroid gland was identified and preserved in situ. We then rotated the thyroid medially and divided the middle thyroid vein. We opened the tracheoesophageal groove and identified the recurrent laryngeal nerve. It gave a good signal when stimulated. The trachea was exposed medial to the nerve and the inferior pole vessels were divided with silk ties and the harmonic scalpel. I did not clearly identify an inferior parathyroid gland in this location. The gland was dissected off of the trachea and across the midline. There was a long thin pyramidal lobe that was dissected off of the underlying musculature and divided at its most superior aspect with the harmonic scalpel.  We turned to the right and proceeded similarly. The strap muscles were elevated off the lobe and retracted laterally. The superior pole vessels were isolated and divided with silk ties and the harmonic scalpel. The superior parathyroid gland was identified and preserved on a good pedicle. We dissected into the lateral fibrofatty tissues in the central neck and identified the recurrent laryngeal nerve. It gave a good signal when stimulated. The trachea was exposed medial to the nerve and the inferior pole vessels were divided. The inferior parathyroid gland was preserved in situ. The nerve was dissected up to its insertion point at the cricopharyngeal muscle. The remaining attachments of the thyroid to the trachea  were divided and the  gland was completely excised. It was examined for parathyroid tissue and none was seen. We irrigated our wound beds and obtained good hemostasis. We applied SNoW and Vistaseal for additional hemostatic effects. The strap muscles were closed in the midline with running 4-0 Vicryl and the platysma was closed with interrupted Vicryl. The skin was closed with running subcuticular Prolene. The skin was cleaned. Dermabond and Steri-Strips were applied. The Prolene suture was removed. The patient was awakened, extubated, and taken to the postanesthesia care unit in good condition.  EBL: 25 cc  IVF: See anesthesia record  Specimen(s): Total thyroid to pathology for permanent section  Complications: none immediately apparent.   Counts: all needles, instruments, and sponges were counted and reported to be correct in number at the end of the case.   I was present for and participated in the entire operation.  Fredirick Maudlin 1:08 PM

## 2020-06-23 NOTE — Anesthesia Preprocedure Evaluation (Signed)
Anesthesia Evaluation  Patient identified by MRN, date of birth, ID band Patient awake  General Assessment Comment:Pleasant, answers all questions appropriately.  Reviewed: Allergy & Precautions, NPO status , Patient's Chart, lab work & pertinent test results  History of Anesthesia Complications Negative for: history of anesthetic complications  Airway Mallampati: II  TM Distance: >3 FB Neck ROM: Full    Dental  (+) Poor Dentition, Chipped   Pulmonary asthma , neg sleep apnea, neg COPD, Patient abstained from smoking.Not current smoker,  Well controlled asthma, took inhalers last night.  No compressive airway symptoms from thyroid   Pulmonary exam normal breath sounds clear to auscultation       Cardiovascular Exercise Tolerance: Good METShypertension, (-) CAD and (-) Past MI (-) dysrhythmias  Rhythm:Regular Rate:Normal - Systolic murmurs    Neuro/Psych PSYCHIATRIC DISORDERS Depression Intellectual delay negative neurological ROS     GI/Hepatic neg GERD  ,(+)     (-) substance abuse  , Hepatitis -  Endo/Other  diabetes  Renal/GU negative Renal ROS     Musculoskeletal   Abdominal   Peds  Hematology   Anesthesia Other Findings Past Medical History: No date: Asthma No date: Depression No date: Diabetes mellitus without complication (HCC) No date: Graves' disease with exophthalmos No date: Hypertension No date: Intellectual disability No date: NASH (nonalcoholic steatohepatitis) No date: Obesity  Reproductive/Obstetrics                             Anesthesia Physical Anesthesia Plan  ASA: II  Anesthesia Plan: General   Post-op Pain Management:    Induction: Intravenous  PONV Risk Score and Plan: 4 or greater and Ondansetron, Dexamethasone, Midazolam, Propofol infusion and TIVA  Airway Management Planned: Oral ETT  Additional Equipment: None  Intra-op Plan:    Post-operative Plan: Extubation in OR  Informed Consent: I have reviewed the patients History and Physical, chart, labs and discussed the procedure including the risks, benefits and alternatives for the proposed anesthesia with the patient or authorized representative who has indicated his/her understanding and acceptance.     Dental advisory given  Plan Discussed with: CRNA and Surgeon  Anesthesia Plan Comments: (Discussed risks of anesthesia with patient, including PONV, sore throat, lip/dental damage. Rare risks discussed as well, such as cardiorespiratory and neurological sequelae. Patient understands.)        Anesthesia Quick Evaluation

## 2020-06-23 NOTE — OR Nursing (Signed)
Patient up to bathroom, bed was wet with urine, changed and cleaned bed patient states that she does have periods of incontinence at times.  She has had blood work and is a little unsteady on feet.  No complaints of pain at this time.

## 2020-06-23 NOTE — H&P (Signed)
Chief Complaint  Patient presents with  . New Patient (Initial Visit)    new pt ref Dr.Melissa Sutton eval for total thyroidectomy    HPI Jordan Sutton is a 44 y.o. female.   She has been referred by Dr. Gabriel Sutton for surgical evaluation of Graves' disease. Dr. Gabriel Sutton has followed her since January 2020. Onset of disease was April 2019. She was initiated on methimazole, but she has not been consistently compliant with this medication when she is not staying with her aunt, Jordan Sutton, who helps with her medication management and makes her medical decisions (secondary to intellectual disability). Due to some difficulty with medical compliance, the option of iodine-131 was discussed with the patient and Jordan Sutton. Due to the post ablation sequestration requirements, this is not feasible in their current living situation. As result, surgery has been recommended as the best means of managing Jordan Sutton's Graves' disease.  Ms. Jordan Sutton denies any anxiousness, jitteriness, or hand tremors. She does endorse heart palpitations. She denies any changes in the texture of her hair, skin, or fingernails. No heat or cold intolerance. She denies hyper defecation. No significant fatigue or excess energy. No voice changes or dysphagia. She does endorse frequent throat clearing. She reports photosensitivity, a gritty sensation in her eyes, and frequent watering. Jordan Sutton reports that when she was first diagnosed, Jordan Sutton was losing weight, but has since gained over 60 pounds. Based on Dr. Joycie Peek notes, this appears to be primarily secondary to poor dietary habits. Current methimazole dose is 20 mg twice daily. Her most recent labs (April 16, 2020) show a TSH that is undetectable, a free T4 of 1.18 ng/dL. Alkaline phosphatase is mildly elevated, but AST and ALT are within normal limits. Her hemogram appears normal.       Past Medical History:  Diagnosis Date  . Asthma   . Depression   . Diabetes mellitus without  complication ([place])   . Graves' disease with exophthalmos   . Hypertension   . Intellectual disability   . NASH (nonalcoholic steatohepatitis)   . Obesity     History reviewed. No pertinent surgical history.  Patient states she has never had surgery.       Family History  Problem Relation Age of Onset  . Alcohol abuse Mother   . Hypertension Father   No known family history of Graves' disease or other autoimmune disorders/thyroid problems.  Social History Social History       Tobacco Use  . Smoking status: Never Smoker  . Smokeless tobacco: Never Used  Substance Use Topics  . Alcohol use: Not Currently  . Drug use: Not on file    No Known Allergies        Current Outpatient Medications  Medication Sig Dispense Refill  . albuterol (VENTOLIN HFA) 108 (90 Base) MCG/ACT inhaler Inhale into the lungs.    Marland Kitchen amphetamine-dextroamphetamine (ADDERALL XR) 20 MG 24 hr capsule     . Blood Glucose Monitoring Suppl (FIFTY50 GLUCOSE METER 2.0) w/Device KIT Use as directed E11.9    . Cysteamine Bitartrate (PROCYSBI) 300 MG PACK Use 1 each once daily    . gabapentin (NEURONTIN) 100 MG capsule     . glipiZIDE (GLUCOTROL XL) 5 MG 24 hr tablet Take by mouth.    Marland Kitchen glucose blood (PRECISION QID TEST) test strip Use 1 each (1 strip total) once daily Use as instructed. E11.9    . haloperidol (HALDOL) 2 MG tablet Take by mouth.    Marland Kitchen ipratropium (ATROVENT HFA) 17 MCG/ACT inhaler Inhale  into the lungs.    . metFORMIN (GLUCOPHAGE) 1000 MG tablet Take 1 tablet (1,000 mg total) by mouth 2 (two) times daily with meals    . methimazole (TAPAZOLE) 10 MG tablet     . sertraline (ZOLOFT) 100 MG tablet Take by mouth.    . simvastatin (ZOCOR) 40 MG tablet     . Spacer/Aero-Holding Chambers (EASIVENT) inhaler See admin instructions.    . topiramate (TOPAMAX) 50 MG tablet Take by mouth.    Marland Kitchen VYVANSE 30 MG capsule      No current facility-administered  medications for this visit.    Review of Systems Review of Systems  Eyes: Positive for photophobia, pain, discharge, redness and itching.  Cardiovascular: Positive for palpitations.  Skin:       Pruritus  All other systems reviewed and are negative. Or as discussed in the history of present illness.  Today's Vitals   06/23/20 0935  BP: (!) 144/91  Pulse: 92  Resp: 16  Temp: 98.4 F (36.9 C)  TempSrc: Oral  SpO2: 100%  Weight: 99.3 kg  Height: 5' 8"  (1.727 m)  PainSc: 0-No pain   Body mass index is 33.3 kg/m.   Physical Exam Physical Exam Constitutional:      General: She is not in acute distress.    Appearance: She is obese.  HENT:     Head: Normocephalic and atraumatic.     Nose:     Comments: Covered with a mask    Mouth/Throat:     Comments: Covered with a mask Eyes:     General: No scleral icterus.    Comments: Proptosis and lid lag are present. She has significant periorbital edema.  Neck:     Comments: The thyroid is diffusely enlarged. It is nontender. No discrete masses are palpated. The trachea is midline. The thyroid gland moves freely with deglutition. No thyroid bruit is identified. No palpable cervical or supraclavicular lymphadenopathy. Cardiovascular:     Rate and Rhythm: Normal rate and regular rhythm.     Pulses: Normal pulses.  Pulmonary:     Effort: Pulmonary effort is normal. No respiratory distress.   Abdominal:     General: Bowel sounds are normal.     Palpations: Abdomen is soft.  Genitourinary:    Comments: Deferred Musculoskeletal:        General: No deformity or signs of injury.     Right lower leg: No edema.     Left lower leg: No edema.     Comments: No pretibial dermopathy.  Skin:    General: Skin is warm and dry.  Neurological:     General: No focal deficit present.     Mental Status: She is alert. Mental status is at baseline.  Psychiatric:        Mood and Affect: Mood normal.        Behavior: Behavior normal.      Data Reviewed As discussed in the history of present illness, I have reviewed Dr. Joycie Peek clinic notes regarding this patient. These include the most recent thyroid function tests as reported above.  There are no relevant imaging studies for review.  Assessment This is a 44 year old woman with intellectual disability and Graves' disease. She has had some difficulty maintaining medical compliance with her methimazole. She and her aunt, with whom she lives and who makes her medical decisions for her, wish to pursue more permanent treatment of her hyperthyroidism. Due to their living situation, post ablative sequestration is not possible and  therefore they wish to proceed with total thyroidectomy.  Plan The risks of thyroid surgery were discussed, including (but not limited to): bleeding, infection, damage to surrounding structures/tissues, injury (temporary or permanent) to the recurrent laryngeal nerve, hypoparathyroidism (temporary or permanent), need for thyroid hormone replacement therapy, need for additional surgery and/or treatment, recurrence of disease, tracheostomy (temporary or permanent).  The patient and her aunt had the opportunity to ask any questions and these were answered to their satisfaction.  At this time, her current free T4 is within a safe and acceptable range for surgical intervention. They were encouraged to continue strict adherence to medical compliance to make sure that we do not run into issues with symptomatic hyperthyroidism or thyroid storm around the time of her surgery. 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 This has been demonstrated to be ameliorated to a significant degree by preoperative calcium supplementation starting 10 days to 2 weeks prior to surgery. We will have the patient take 2 Tums 3 times daily starting 2 weeks prior to her operation. I do not think she requires any SSKI or other preemptory  medications. We will work on getting her scheduled.  Today she reports that she has been diligent in taking her thyroid medication and took the calcium as recommended.  We will proceed with total thyroidectomy today, as planned.

## 2020-06-23 NOTE — Transfer of Care (Signed)
Immediate Anesthesia Transfer of Care Note  Patient: Jordan Sutton  Procedure(s) Performed: THYROIDECTOMY, total (N/A Neck)  Patient Location: PACU  Anesthesia Type:General  Level of Consciousness: drowsy  Airway & Oxygen Therapy: Patient Spontanous Breathing and Patient connected to face mask oxygen  Post-op Assessment: Report given to RN and Post -op Vital signs reviewed and stable  Post vital signs: Reviewed and stable  Last Vitals:  Vitals Value Taken Time  BP 136/73 06/23/20 1300  Temp 36.8 C 06/23/20 1300  Pulse 78 06/23/20 1306  Resp 21 06/23/20 1306  SpO2 93 % 06/23/20 1306  Vitals shown include unvalidated device data.  Last Pain:  Vitals:   06/23/20 0935  TempSrc: Oral  PainSc: 0-No pain         Complications: No complications documented.

## 2020-06-23 NOTE — Anesthesia Postprocedure Evaluation (Signed)
Anesthesia Post Note  Patient: Jordan Sutton  Procedure(s) Performed: THYROIDECTOMY, total (N/A Neck)  Patient location during evaluation: PACU Anesthesia Type: General Level of consciousness: awake and alert Pain management: pain level controlled Vital Signs Assessment: post-procedure vital signs reviewed and stable Respiratory status: spontaneous breathing, nonlabored ventilation, respiratory function stable and patient connected to nasal cannula oxygen Cardiovascular status: blood pressure returned to baseline and stable Postop Assessment: no apparent nausea or vomiting Anesthetic complications: no   No complications documented.   Last Vitals:  Vitals:   06/23/20 1300 06/23/20 1315  BP: 136/73 129/77  Pulse: 85 74  Resp: (!) 21 (!) 21  Temp: 36.8 C   SpO2: 91% 94%    Last Pain:  Vitals:   06/23/20 0935  TempSrc: Oral  PainSc: 0-No pain                 Arita Miss

## 2020-06-23 NOTE — Anesthesia Procedure Notes (Signed)
Procedure Name: Intubation Date/Time: 06/23/2020 10:36 AM Performed by: Allean Found, CRNA Pre-anesthesia Checklist: Patient identified, Patient being monitored, Timeout performed, Emergency Drugs available and Suction available Patient Re-evaluated:Patient Re-evaluated prior to induction Oxygen Delivery Method: Circle system utilized Preoxygenation: Pre-oxygenation with 100% oxygen Induction Type: IV induction Ventilation: Mask ventilation without difficulty Laryngoscope Size: 3 and McGraph Grade View: Grade I Tube type: Oral Tube size: 7.0 mm Number of attempts: 1 Airway Equipment and Method: Stylet Placement Confirmation: ETT inserted through vocal cords under direct vision,  positive ETCO2 and breath sounds checked- equal and bilateral Secured at: 21 cm Tube secured with: Tape Dental Injury: Teeth and Oropharynx as per pre-operative assessment  Comments: NIM ETT. Video laryngoscopy utilized. Surgeon confirmed placement.

## 2020-06-24 ENCOUNTER — Encounter: Payer: Self-pay | Admitting: General Surgery

## 2020-06-24 DIAGNOSIS — E0500 Thyrotoxicosis with diffuse goiter without thyrotoxic crisis or storm: Secondary | ICD-10-CM | POA: Diagnosis not present

## 2020-06-24 LAB — CALCIUM: Calcium: 9.1 mg/dL (ref 8.9–10.3)

## 2020-06-24 LAB — ALBUMIN: Albumin: 3.4 g/dL — ABNORMAL LOW (ref 3.5–5.0)

## 2020-06-24 MED ORDER — LEVOTHYROXINE SODIUM 150 MCG PO TABS: 150.0000 ug | ORAL_TABLET | Freq: Every day | ORAL | 3 refills | Status: AC

## 2020-06-24 MED ORDER — ACETAMINOPHEN 500 MG PO TABS
ORAL_TABLET | ORAL | Status: AC
  Administered 2020-06-24: 1000 mg via ORAL
  Filled 2020-06-24: qty 2

## 2020-06-24 MED ORDER — ACETAMINOPHEN 500 MG PO TABS
ORAL_TABLET | ORAL | Status: AC
  Filled 2020-06-24: qty 2

## 2020-06-24 MED ORDER — CALCIUM CARBONATE 1250 (500 CA) MG PO TABS: 2.0000 | ORAL_TABLET | Freq: Three times a day (TID) | ORAL | 1 refills | Status: AC

## 2020-06-24 MED ORDER — OXYCODONE HCL 5 MG PO TABS: 5.0000 mg | ORAL_TABLET | Freq: Four times a day (QID) | ORAL | 0 refills | Status: DC | PRN

## 2020-06-24 MED ORDER — LISDEXAMFETAMINE DIMESYLATE 30 MG PO CAPS
30.0000 mg | ORAL_CAPSULE | Freq: Every day | ORAL | Status: DC
  Administered 2020-06-24: 20 mg via ORAL

## 2020-06-24 MED ORDER — IBUPROFEN 600 MG PO TABS: 600.0000 mg | ORAL_TABLET | Freq: Four times a day (QID) | ORAL | 0 refills | Status: AC | PRN

## 2020-06-24 MED ORDER — LISDEXAMFETAMINE DIMESYLATE 30 MG PO CAPS
30.0000 mg | ORAL_CAPSULE | Freq: Every day | ORAL | Status: DC
  Administered 2020-06-24: 30 mg via ORAL

## 2020-06-24 NOTE — Progress Notes (Signed)
Nsg Discharge Note  Admit Date:  06/23/2020 Discharge date: 06/24/2020   Jordan Sutton to be D/C'd Home per MD order.  AVS completed.   Patient/caregiver able to verbalize understanding.  Discharge Medication: Allergies as of 06/24/2020   No Known Allergies     Medication List    TAKE these medications   albuterol 108 (90 Base) MCG/ACT inhaler Commonly known as: VENTOLIN HFA Inhale 1-2 puffs into the lungs every 6 (six) hours as needed for wheezing or shortness of breath.   amphetamine-dextroamphetamine 20 MG 24 hr capsule Commonly known as: ADDERALL XR Take 20 mg by mouth daily.   calcium carbonate 1250 (500 Ca) MG tablet Commonly known as: OS-CAL - dosed in mg of elemental calcium Take 2 tablets (1,000 mg of elemental calcium total) by mouth 3 (three) times daily with meals. Take TWO tablets (10107m) THREE times daily for 1 week. After that week, take ONE tablet (5067m THREE times daily until your follow up appointment   Fifty50 Glucose Meter 2.0 w/Device Kit Use as directed E11.9   gabapentin 100 MG capsule Commonly known as: NEURONTIN Take 100 mg by mouth at bedtime.   glipiZIDE 5 MG 24 hr tablet Commonly known as: GLUCOTROL XL Take 5 mg by mouth daily with breakfast.   haloperidol 2 MG tablet Commonly known as: HALDOL Take 2 mg by mouth in the morning.   ibuprofen 600 MG tablet Commonly known as: ADVIL Take 1 tablet (600 mg total) by mouth every 6 (six) hours as needed (for mild pain not relieved by other medications.).   ipratropium 17 MCG/ACT inhaler Commonly known as: ATROVENT HFA Inhale 1 puff into the lungs every 6 (six) hours as needed for wheezing.   levothyroxine 150 MCG tablet Commonly known as: SYNTHROID Take 1 tablet (150 mcg total) by mouth daily at 6 (six) AM. Start taking on: June 25, 2020   metFORMIN 1000 MG tablet Commonly known as: GLUCOPHAGE Take 1,000 mg by mouth 2 (two) times daily with a meal.   oxyCODONE 5 MG immediate release  tablet Commonly known as: Oxy IR/ROXICODONE Take 1 tablet (5 mg total) by mouth every 6 (six) hours as needed for severe pain or breakthrough pain.   Procysbi 300 MG Pack Generic drug: Cysteamine Bitartrate Use 1 each once daily   sertraline 100 MG tablet Commonly known as: ZOLOFT Take 100 mg by mouth daily.   simvastatin 40 MG tablet Commonly known as: ZOCOR Take 40 mg by mouth daily.   topiramate 50 MG tablet Commonly known as: TOPAMAX Take 50 mg by mouth daily.   Vyvanse 50 MG capsule Generic drug: lisdexamfetamine Take 50 mg by mouth daily.       Discharge Assessment: Vitals:   06/24/20 0423 06/24/20 1030  BP: (!) 148/90 (!) 157/91  Pulse: 60 67  Resp: 16 16  Temp: 97.6 F (36.4 C) 98.1 F (36.7 C)  SpO2: 99% 100%   Skin clean, dry and intact without evidence of skin break down, no evidence of skin tears noted. IV catheter discontinued intact. Site without signs and symptoms of complications - no redness or edema noted at insertion site, patient denies c/o pain - only slight tenderness at site.  Dressing with slight pressure applied.  D/c Instructions-Education: Discharge instructions given to patient/family with verbalized understanding. D/c education completed with patient/family including follow up instructions, medication list, d/c activities limitations if indicated, with other d/c instructions as indicated by MD - patient able to verbalize understanding, all questions fully answered. Patient  instructed to return to ED, call 911, or call MD for any changes in condition.  Patient escorted via [place], and D/C home via private auto.  Tresa Endo, RN 06/24/2020 10:37 AM

## 2020-06-24 NOTE — Discharge Summary (Addendum)
[facility] SURGICAL DISCHARGE SUMMARY  Patient ID: Jordan Sutton MRN: [id number] DOB/AGE: 10-11-1975 44 y.o.  Admit date: 06/23/2020 Discharge date: 06/24/2020  Discharge Diagnoses Patient Active Problem List   Diagnosis Date Noted   S/P total thyroidectomy 06/23/2020   Graves' disease with exophthalmos     Consultants None  Procedures 06/23/2020:  Total thyroidectomy  HPI: Jordan Sutton is a 44 y.o. female with Graves' disease. She has been managed medically, but has had some difficulty adhering to medication regimen secondary to developmental delay. Due to her living situation, radioactive iodine ablation was not an option for definitive treatment and therefore total thyroidectomy was recommended. The risks of the operation were discussed with the patient as well as her legal guardian. They accepted these risks and wished to proceed with surgery.  Hospital Course: Informed consent was obtained and documented, and patient underwent uneventful total thyroidectomy (Dr Celine Ahr, 06/23/2020).  Post-operatively, patient did well and her calcium levels were normal at time of discharge. Advancement of patient's diet and ambulation were well-tolerated. The remainder of patient's hospital course was essentially unremarkable, and discharge planning was initiated accordingly with patient safely able to be discharged home with appropriate discharge instructions, pain control, and outpatient follow-up after all of her questions were answered to her expressed satisfaction.   Discharge Condition: Good   Physical Examination:  Constitutional: well appearing female, NAD HEENT: Covered with mask Neck: 6 cm transverse incision to the anterior neck, CDI with steri-strips, some edema, no erythema or drainage Pulmonary: Normal effort, no respiratory distress, some hoarseness to voice, goo dphonation   Allergies as of 06/24/2020   No Known Allergies      Medication List     TAKE  these medications    albuterol 108 (90 Base) MCG/ACT inhaler Commonly known as: VENTOLIN HFA Inhale 1-2 puffs into the lungs every 6 (six) hours as needed for wheezing or shortness of breath.   amphetamine-dextroamphetamine 20 MG 24 hr capsule Commonly known as: ADDERALL XR Take 20 mg by mouth daily.   calcium carbonate 1250 (500 Ca) MG tablet Commonly known as: OS-CAL - dosed in mg of elemental calcium Take 2 tablets (1,000 mg of elemental calcium total) by mouth 3 (three) times daily with meals. Take TWO tablets (101m) THREE times daily for 1 week. After that week, take ONE tablet (5053m THREE times daily until your follow up appointment   Fifty50 Glucose Meter 2.0 w/Device Kit Use as directed E11.9   gabapentin 100 MG capsule Commonly known as: NEURONTIN Take 100 mg by mouth at bedtime.   glipiZIDE 5 MG 24 hr tablet Commonly known as: GLUCOTROL XL Take 5 mg by mouth daily with breakfast.   haloperidol 2 MG tablet Commonly known as: HALDOL Take 2 mg by mouth in the morning.   ibuprofen 600 MG tablet Commonly known as: ADVIL Take 1 tablet (600 mg total) by mouth every 6 (six) hours as needed (for mild pain not relieved by other medications.).   ipratropium 17 MCG/ACT inhaler Commonly known as: ATROVENT HFA Inhale 1 puff into the lungs every 6 (six) hours as needed for wheezing.   levothyroxine 150 MCG tablet Commonly known as: SYNTHROID Take 1 tablet (150 mcg total) by mouth daily at 6 (six) AM. Start taking on: June 25, 2020   metFORMIN 1000 MG tablet Commonly known as: GLUCOPHAGE Take 1,000 mg by mouth 2 (two) times daily with a meal.   oxyCODONE 5 MG immediate release tablet Commonly known as: Oxy IR/ROXICODONE Take 1  tablet (5 mg total) by mouth every 6 (six) hours as needed for severe pain or breakthrough pain.   Procysbi 300 MG Pack Generic drug: Cysteamine Bitartrate Use 1 each once daily   sertraline 100 MG tablet Commonly known as: ZOLOFT Take  100 mg by mouth daily.   simvastatin 40 MG tablet Commonly known as: ZOCOR Take 40 mg by mouth daily.   topiramate 50 MG tablet Commonly known as: TOPAMAX Take 50 mg by mouth daily.   Vyvanse 50 MG capsule Generic drug: lisdexamfetamine Take 50 mg by mouth daily.          Follow-up Information     Fredirick Maudlin, MD. Schedule an appointment as soon as possible for a visit in 2 week(s).   Specialty: General Surgery Why: s/p total thyroidectomy  Contact information: [address] [place] [phone]                  Time spent on discharge management including discussion of hospital course, clinical condition, outpatient instructions, prescriptions, and follow up with the patient and members of the medical team: >30 minutes  -- Edison Simon , PA-C [facility]  06/24/2020, 9:57 AM [phone] M-F: 7am - 4pm  I saw and evaluated the patient.  I agree with the above documentation, exam, and plan, which I have edited where appropriate. Fredirick Maudlin  10:37 AM

## 2020-06-24 NOTE — Discharge Instructions (Signed)
Post-operative Home Care After Thyroid or Parathyroid Surgery  What should I expect after my operation?   Marland Kitchen You will see swelling under the incision and/or bruising under it in a few days. This is usually greatest on the second or third day after the operation. You may also feel the sensation of swelling and/or of firmness that can last for a month or more.    . Neck incisions heal rapidly, within a week or two. You can get them damp after about 24 hours, however do not submerge the incision or allow it to become soaked or saturated with water or sweat for 2 weeks.   . Your scar will be most visible 1-2 months after the operation and gradually fade over the next 6-8 months. As it heals, a scar looks more pink or red than the skin around it.   Dennis Bast may feel a firm 'healing ridge' directly under the incision. This is normal and will soften and go away when healing is complete within 3-6 months.   . All incisions are sensitive to sunlight.  For one year after surgery, you should use sunscreen when outdoors for long periods to prevent darkening of the scar area.    . We recommend that you not expose the incision to the ultraviolet lights used in tanning booths.    Will my neck hurt?  . Most patients experience very little pain, but you may feel some neck stiffness/soreness in your shoulder, back or neck and tension headache that takes a few days or weeks to go away completely.    . Some patients also notice minor changes in swallowing, which improve over time.   . The skin just above and below your incision will feel numb. This will improve over several months, but some persons may have a longterm decrease in sensation.   .  You may apply cold pack over your incision to improve the pain.    How will I manage my pain at home?  . Take NSAIDS like ibuprofen (Motrin, Advil), naproxen (Naprosyn, Aleve) or acetaminophen (Tylenol) every 6 hours for the first 3-5 days after operation. This will  minimize the pain you feel.      ? To prevent Tylenol overdose, do not take a Tylenol doses at the same time as a combination narcotic dose that contains Tylenol, like Vicodin and Darvocet. However, You may take them 4-6 hours apart.     . If you have sore/stiff muscles in your back, shoulder or neck, you may use moist warm heat or heating pad on the affected areas 15-20 minutes at a time several times a day.   . Gently massaging your neck muscles will improve the neck stiffness.    . Do not be afraid to move your neck. Gently flexing and stretching your neck muscles will prevent stiffness.    . Stronger pain medication or narcotic (like Vicodin, Darvocet) for severe pain is rarely needed. If it is, however, DO NOT drive a car or drink alcohol while taking these medications.    . Narcotics also cause constipation. Stool softeners (Colace) and fiber (fruits, bran, vegetables) and extra fluid intake helps. A stimulant laxative (Milk of Magnesia, Senokot) may be needed as well.    Will my voice be affected?  Your voice may be hoarse or weak at first, because the surgery took place near the voice box, but usually recovers within weeks. Some patients also notice a change in the pitch of their voices that affects  singing. Rarely, these changes can be permanent.   Are there any diet restrictions?  No, return to your previous diet and always eat a well balanced diet, low in fat, etc.    How will I care for my incision?  . If you have paper "steri strips" on your incision, leave them in place until they begin to fall off naturally. If they become discolored or messy, you may remove them 7-10 days after your operation.   . If you have a skin glue (Dermabond) closure, you may notice tiny pieces of yellow material on your washcloth. Any sutures (stitches) you may have are dissolvable and do not need to be removed.  . You may shower then gently pat dry your incision.   . Do not apply ointments or  powders.   . Avoid using Vitamin E cream or other moisturizers on the incision until after your first follow-up visit.   What new medications might I take home?   ? Calcium supplement:  Your body's calcium levels may fall after a total thyroidectomy or parathyroid operation. We recommend you purchase Os-Cal 500 (one tablet equals 500 mg of elemental calcium) or Citracal (2 tablets equal 630 mg of elemental calcium; the "Petites" version contains 500 mg elemental calcium per 2 tablets). You may be taking 3-6 (or more) tablets per day, depending on your doctor's recommendation. You will need to take calcium at different times to avoid medication interaction. Ask your pharmacists, nurse, or doctor about specific interactions.   ? Thyroid Hormone:  If you have had a thyroid operation, you may be prescribed thyroid hormone replacement, called levothyroxine (Synthroid, Levothroid, etc.). A blood test will be done in 6-8 weeks to ensure the dosage is correct  by your doctor or your surgeon.   ? Vitamin D:  If your doctor has prescribed a Vitamin D supplement, like Calcitriol (Rocaltrol), try to get it filled at the hospital pharmacy before you leave.  Sometimes regular pharmacies do not stock it and will need to order it in.  When can I go back to normal activities?  Marland Kitchen You may return to work in 5-7 days or sooner if desired. Contact the clinic coordinator if you need employer forms completed.   . You may drive as long as you are not taking any narcotics and your neck stiffness is resolved    Can I resume my previous medications?   . Yes, unless directed not to by your doctor.   . Before discharge, be sure to review your previous medications with your doctor or inpatient medical team.    When do I call for advice?   - If your temperature > 101.5  - You have trouble talking or breathing  - Your fingers/ hands or face or around your lips becomes numb and tingling. (This may mean your calcium level is  low.)  - You have trouble swallowing  - Your incision becomes swollen, red or drainage occurs.

## 2020-06-25 LAB — SURGICAL PATHOLOGY

## 2020-10-23 ENCOUNTER — Encounter: Payer: Self-pay | Admitting: General Surgery

## 2020-10-23 ENCOUNTER — Other Ambulatory Visit: Payer: Self-pay

## 2020-10-23 ENCOUNTER — Ambulatory Visit (INDEPENDENT_AMBULATORY_CARE_PROVIDER_SITE_OTHER): Payer: Medicare Other | Admitting: General Surgery

## 2020-10-23 VITALS — BP 143/83 | HR 89 | Temp 98.3°F | Ht 68.0 in | Wt 218.0 lb

## 2020-10-23 DIAGNOSIS — R490 Dysphonia: Secondary | ICD-10-CM

## 2020-10-23 NOTE — Patient Instructions (Addendum)
We will refer you to Dr. Rowe Clack in [place] to evaluate your voice. They will call you for an appointment. If you have concerns or questions, please feel free to call our office.

## 2020-10-23 NOTE — Progress Notes (Signed)
Patient ID: Jordan Sutton, female   DOB: [birth year], 45 y.o.   MRN: [id number]  Chief Complaint  Patient presents with   Other    HPI Jacklyne Baik is a 45 y.o. female.   She underwent a total thyroidectomy for Graves' disease in November 2021.  For reasons that are unclear, she did not follow-up postoperatively.  She is here today for a visit.  She is accompanied by a peers support individual, as her aunt [name] (her guardian) is unable to attend the visit.  During the visit, I spoke with [name] by telephone on speaker and gathered much of the pertinent data in this manner.  Since surgery, Tamika reports that she is swallowing without difficulty.  She is eating well.  She denies any nausea or vomiting.  No fevers or chills.  She feels like she is doing okay with her thyroid medicine.  She has had a husky voice since surgery, however.  Looking back in her chart, it appears that her primary care provider obtain thyroid function studies on February 23.  These show a TSH of 15.09 and a free T4 of 1.24 (upper limit of normal 1.21).  These labs are highly suggestive of a patient who had not taken her thyroid medication for quite some time and then began taking it again shortly prior to the labs being drawn.  Discussion with [name] indicates that this was in fact the case.  Shanay apparently missed about a month of her levothyroxine and then did resume taking it shortly before her primary care visit.  She denies any heart palpitations or hand tremors.  No changes in her hair, skin, or fingernails.  She feels like her weight has been stable, but she states that she "needs to lose some weight."  She has not had any follow-up with Dr. Gabriel Carina.   Past Medical History:  Diagnosis Date   Asthma    Depression    Diabetes mellitus without complication ([place])    Graves' disease with exophthalmos    Hypertension    Intellectual disability    NASH (nonalcoholic steatohepatitis)    Obesity     Past  Surgical History:  Procedure Laterality Date   THYROIDECTOMY N/A 06/23/2020   Procedure: THYROIDECTOMY, total;  Surgeon: Fredirick Maudlin, MD;  Location: [facility] ORS;  Service: General;  Laterality: N/A;    Family History  Problem Relation Age of Onset   Alcohol abuse Mother    Hypertension Father     Social History Social History   Tobacco Use   Smoking status: Never Smoker   Smokeless tobacco: Never Used  Scientific laboratory technician Use: Never used  Substance Use Topics   Alcohol use: Not Currently   Drug use: Never    No Known Allergies  Current Outpatient Medications  Medication Sig Dispense Refill   albuterol (VENTOLIN HFA) 108 (90 Base) MCG/ACT inhaler Inhale 1-2 puffs into the lungs every 6 (six) hours as needed for wheezing or shortness of breath.      amphetamine-dextroamphetamine (ADDERALL XR) 20 MG 24 hr capsule Take 20 mg by mouth daily.      calcium carbonate (OS-CAL - DOSED IN MG OF ELEMENTAL CALCIUM) 1250 (500 Ca) MG tablet Take 2 tablets (1,000 mg of elemental calcium total) by mouth 3 (three) times daily with meals. Take TWO tablets (1018m) THREE times daily for 1 week. After that week, take ONE tablet (5098m THREE times daily until your follow up appointment 120 tablet 1   gabapentin (NEURONTIN) 100  MG capsule Take 100 mg by mouth at bedtime.      glipiZIDE (GLUCOTROL XL) 5 MG 24 hr tablet Take 5 mg by mouth daily with breakfast.      haloperidol (HALDOL) 2 MG tablet Take 2 mg by mouth in the morning.      ibuprofen (ADVIL) 600 MG tablet Take 1 tablet (600 mg total) by mouth every 6 (six) hours as needed (for mild pain not relieved by other medications.). 30 tablet 0   ipratropium (ATROVENT HFA) 17 MCG/ACT inhaler Inhale 1 puff into the lungs every 6 (six) hours as needed for wheezing.      levothyroxine (SYNTHROID) 150 MCG tablet Take 1 tablet (150 mcg total) by mouth daily at 6 (six) AM. 30 tablet 3   metFORMIN (GLUCOPHAGE) 1000 MG tablet Take 1,000  mg by mouth 2 (two) times daily with a meal.      oxyCODONE (OXY IR/ROXICODONE) 5 MG immediate release tablet Take 1 tablet (5 mg total) by mouth every 6 (six) hours as needed for severe pain or breakthrough pain. 15 tablet 0   sertraline (ZOLOFT) 100 MG tablet Take 100 mg by mouth daily.      simvastatin (ZOCOR) 40 MG tablet Take 40 mg by mouth daily.      topiramate (TOPAMAX) 50 MG tablet Take 50 mg by mouth daily.      VYVANSE 50 MG capsule Take 50 mg by mouth daily.      No current facility-administered medications for this visit.    Review of Systems Review of Systems  All other systems reviewed and are negative. Or as discussed in the history of present illness.  Blood pressure (!) 143/83, pulse 89, temperature 98.3 F (36.8 C), temperature source Oral, height 5' 8"  (1.727 m), weight 218 lb (98.9 kg), last menstrual period 10/16/2020, SpO2 99 %. Body mass index is 33.15 kg/m.  Physical Exam Physical Exam Constitutional:      General: She is not in acute distress.    Appearance: She is obese.  HENT:     Head: Normocephalic and atraumatic.  Eyes:     General: No scleral icterus.       Right eye: No discharge.        Left eye: No discharge.     Comments: Moderate periorbital edema.  Neck:     Comments: The thyroid is surgically absent.  The trachea is midline.  No palpable cervical or supraclavicular lymphadenopathy.  Her thyroidectomy scar has healed well. Cardiovascular:     Rate and Rhythm: Normal rate and regular rhythm.  Pulmonary:     Effort: Pulmonary effort is normal. No respiratory distress.  Abdominal:     Palpations: Abdomen is soft.     Comments: Protuberant, consistent with her level of obesity.  Skin:    General: Skin is warm and dry.  Neurological:     General: No focal deficit present.     Mental Status: She is alert. Mental status is at baseline.  Psychiatric:        Mood and Affect: Mood normal.        Behavior: Behavior normal.     Data  Reviewed Labs reviewed, as discussed in the history of present illness  Assessment This is a 45 year old woman with Graves' disease who had a total thyroidectomy in November 2021.  She did not follow-up in our clinic until today.  Recent labs with her primary care provider suggested that she had not been taking her thyroid medication  on a regular basis.  After discussing this with her guardian, this proved to be the case.  [name] states that she has the medication now on auto refill and confirmed that the patient is taking it appropriately, first thing in the morning on an empty stomach.  They are aware that it is very important to continue this medication and not have another significant gap in her therapy.  She also has a husky quality to her voice that was not present prior to surgery.  Intraoperative nerve monitoring was used during the procedure and both recurrent laryngeal nerves were well visualized and gave excellent functional signals throughout the operation.  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   Plan The plan was communicated to Ms. Corsi, her aunt [name], and the peer who accompanied her today.  I think we have a good system in place to ensure that she gets her thyroid medication on a regular basis.  I also encouraged them to make a follow-up appointment with Dr. Gabriel Carina for ongoing management of her thyroid hormone replacement and diabetes.  I have placed a referral to Dr. Johnna Acosta, an otolaryngologist who specializes in voice and swallowing disorders at [facility] for further evaluation of the vocal quality changes that have occurred since surgery.  At this time, I do not think Ms. Bajorek has any ongoing endocrine surgical needs.  I will see her on an as-needed basis.    Fredirick Maudlin 10/23/2020, 2:38 PM

## 2020-10-30 ENCOUNTER — Telehealth: Payer: Self-pay

## 2020-10-30 NOTE — Telephone Encounter (Signed)
Spoke with Apolonio Schneiders at [facility] to check on referral. Apolonio Schneiders made an appt with Dr. Rowe Clack for 11/27/2020 @ 2pm with a voice evaluation to follow afterwards. LVM for Ms. Jimmye Norman of the details.  Refaxed referral to [facility] per Rachel's request.

## 2020-11-19 ENCOUNTER — Other Ambulatory Visit: Payer: Self-pay | Admitting: Family Medicine

## 2020-11-19 DIAGNOSIS — Z1231 Encounter for screening mammogram for malignant neoplasm of breast: Secondary | ICD-10-CM

## 2021-01-12 ENCOUNTER — Ambulatory Visit
Admission: RE | Admit: 2021-01-12 | Discharge: 2021-01-12 | Disposition: A | Discharge status: Home / Self Care | Payer: Medicare HMO | Source: Ambulatory Visit | Attending: Family Medicine | Admitting: Family Medicine

## 2021-01-12 ENCOUNTER — Other Ambulatory Visit: Payer: Self-pay

## 2021-01-12 DIAGNOSIS — Z1231 Encounter for screening mammogram for malignant neoplasm of breast: Secondary | ICD-10-CM | POA: Diagnosis present

## 2021-05-22 ENCOUNTER — Encounter: Payer: Self-pay | Admitting: General Surgery

## 2022-01-26 ENCOUNTER — Other Ambulatory Visit: Payer: Self-pay | Admitting: Family Medicine

## 2022-01-26 DIAGNOSIS — Z1231 Encounter for screening mammogram for malignant neoplasm of breast: Secondary | ICD-10-CM

## 2022-03-01 ENCOUNTER — Ambulatory Visit
Admission: RE | Admit: 2022-03-01 | Discharge: 2022-03-01 | Disposition: A | Discharge status: Home / Self Care | Payer: Medicare HMO | Source: Ambulatory Visit | Attending: Family Medicine | Admitting: Family Medicine

## 2022-03-01 DIAGNOSIS — Z1231 Encounter for screening mammogram for malignant neoplasm of breast: Secondary | ICD-10-CM | POA: Insufficient documentation

## 2022-08-24 ENCOUNTER — Inpatient Hospital Stay: Payer: Medicare HMO

## 2022-08-24 ENCOUNTER — Encounter: Payer: Self-pay | Admitting: Internal Medicine

## 2022-08-24 ENCOUNTER — Inpatient Hospital Stay: Payer: Medicare HMO | Attending: Internal Medicine | Admitting: Internal Medicine

## 2022-08-24 VITALS — BP 127/66 | HR 73 | Resp 18 | Wt 213.0 lb

## 2022-08-24 DIAGNOSIS — N920 Excessive and frequent menstruation with regular cycle: Secondary | ICD-10-CM | POA: Diagnosis not present

## 2022-08-24 DIAGNOSIS — D509 Iron deficiency anemia, unspecified: Secondary | ICD-10-CM | POA: Insufficient documentation

## 2022-08-24 DIAGNOSIS — Q909 Down syndrome, unspecified: Secondary | ICD-10-CM | POA: Insufficient documentation

## 2022-08-24 DIAGNOSIS — D649 Anemia, unspecified: Secondary | ICD-10-CM

## 2022-08-24 DIAGNOSIS — E0500 Thyrotoxicosis with diffuse goiter without thyrotoxic crisis or storm: Secondary | ICD-10-CM | POA: Insufficient documentation

## 2022-08-24 LAB — CBC WITH DIFFERENTIAL/PLATELET
Abs Immature Granulocytes: 0.02 10*3/uL (ref 0.00–0.07)
Basophils Absolute: 0.1 10*3/uL (ref 0.0–0.1)
Basophils Relative: 1 %
Eosinophils Absolute: 0.7 10*3/uL — ABNORMAL HIGH (ref 0.0–0.5)
Eosinophils Relative: 9 %
HCT: 30.9 % — ABNORMAL LOW (ref 36.0–46.0)
Hemoglobin: 9.1 g/dL — ABNORMAL LOW (ref 12.0–15.0)
Immature Granulocytes: 0 %
Lymphocytes Relative: 26 %
Lymphs Abs: 2 10*3/uL (ref 0.7–4.0)
MCH: 20.1 pg — ABNORMAL LOW (ref 26.0–34.0)
MCHC: 29.4 g/dL — ABNORMAL LOW (ref 30.0–36.0)
MCV: 68.4 fL — ABNORMAL LOW (ref 80.0–100.0)
Monocytes Absolute: 0.4 10*3/uL (ref 0.1–1.0)
Monocytes Relative: 6 %
Neutro Abs: 4.5 10*3/uL (ref 1.7–7.7)
Neutrophils Relative %: 58 %
Platelets: 582 10*3/uL — ABNORMAL HIGH (ref 150–400)
RBC: 4.52 MIL/uL (ref 3.87–5.11)
RDW: 20.3 % — ABNORMAL HIGH (ref 11.5–15.5)
WBC: 7.7 10*3/uL (ref 4.0–10.5)
nRBC: 0 % (ref 0.0–0.2)

## 2022-08-24 LAB — IRON AND TIBC
Iron: 23 ug/dL — ABNORMAL LOW (ref 28–170)
Saturation Ratios: 4 % — ABNORMAL LOW (ref 10.4–31.8)
TIBC: 529 ug/dL — ABNORMAL HIGH (ref 250–450)
UIBC: 506 ug/dL

## 2022-08-24 LAB — FERRITIN: Ferritin: 5 ng/mL — ABNORMAL LOW (ref 11–307)

## 2022-08-24 LAB — VITAMIN B12: Vitamin B-12: 254 pg/mL (ref 180–914)

## 2022-08-24 NOTE — Progress Notes (Signed)
Patient had a diabetic episode and fell, Thursday night, she states she has a constant cough, and occasional constipation. Patient has a guardian. Guardian states patient has an eating disorder.

## 2022-08-24 NOTE — Progress Notes (Signed)
[facility]  [phone] [phone]  ID: Jordan Sutton OB: 1976/02/22  MR#: [id number]  YSA#:[id number]  Patient Care Team: Rayetta Humphrey, MD as PCP - General (Family Medicine)  REFERRING PROVIDER: Dr. Greggory Stallion  REASON FOR REFERRAL: IDA  HPI: Jordan Sutton is a 47 y.o. female with past medical history of ADD, diabetes, asthma, depression, Graves' disease was referred to hematology for iron deficiency anemia  Patient accompanied today with her and who provided most of the information since patient has intellectual disability.  She follows with Dr. Greggory Stallion.  Routine lab work showed hemoglobin of 8.5, MCV 77, WBC and platelets normal.  She has been feeling well.  Denies any NSAIDs or gastric bypass surgery.  Not sure if there is any blood in stools or urine or black tarry stools. Has heavy menstrual period.  She is scheduled for colonoscopy and endoscopy with Dr. Mia Creek in March 2024.  Patient denies fever, chills, nausea, vomiting, shortness of breath, cough, abdominal pain, bleeding, bowel or bladder issues. Energy level is good.  Appetite is good.  Denies any weight loss. Denies pain.  REVIEW OF SYSTEMS:   ROS  As per HPI. Otherwise, a complete review of systems is negative.  PAST MEDICAL HISTORY: Past Medical History:  Diagnosis Date   Asthma    Depression    Diabetes mellitus without complication ([facility])    Graves' disease with exophthalmos    Hypertension    Intellectual disability    NASH (nonalcoholic steatohepatitis)    Obesity     PAST SURGICAL HISTORY: Past Surgical History:  Procedure Laterality Date   THYROIDECTOMY N/A 06/23/2020   Procedure: THYROIDECTOMY, total;  Surgeon: Duanne Guess, MD;  Location: [facility] ORS;  Service: General;  Laterality: N/A;    FAMILY HISTORY: Family History  Problem Relation Age of Onset   Alcohol abuse Mother    Hypertension Father     HEALTH MAINTENANCE: Social History   Tobacco Use    Smoking status: Never   Smokeless tobacco: Never  Vaping Use   Vaping Use: Never used  Substance Use Topics   Alcohol use: Not Currently   Drug use: Never     No Known Allergies  Current Outpatient Medications  Medication Sig Dispense Refill   albuterol (VENTOLIN HFA) 108 (90 Base) MCG/ACT inhaler Inhale 1-2 puffs into the lungs every 6 (six) hours as needed for wheezing or shortness of breath.      amphetamine-dextroamphetamine (ADDERALL XR) 20 MG 24 hr capsule Take 20 mg by mouth daily.      calcium carbonate (OS-CAL - DOSED IN MG OF ELEMENTAL CALCIUM) 1250 (500 Ca) MG tablet Take 2 tablets (1,000 mg of elemental calcium total) by mouth 3 (three) times daily with meals. Take TWO tablets (1000mg ) THREE times daily for 1 week. After that week, take ONE tablet (500mg ) THREE times daily until your follow up appointment 120 tablet 1   fluticasone (FLONASE) 50 MCG/ACT nasal spray Place into the nose.     gabapentin (NEURONTIN) 100 MG capsule Take 100 mg by mouth at bedtime.      haloperidol (HALDOL) 2 MG tablet Take 2 mg by mouth in the morning.      hydrochlorothiazide (HYDRODIURIL) 25 MG tablet Take 25 mg by mouth daily.     ibuprofen (ADVIL) 600 MG tablet Take 1 tablet (600 mg total) by mouth every 6 (six) hours as needed (for mild pain not relieved by other medications.). 30 tablet 0   ipratropium (ATROVENT) 0.02 %  nebulizer solution Take by nebulization.     losartan (COZAAR) 100 MG tablet Take 100 mg by mouth daily.     metFORMIN (GLUCOPHAGE) 1000 MG tablet Take 1,000 mg by mouth 2 (two) times daily with a meal.      omeprazole (PRILOSEC) 20 MG capsule Take by mouth.     sertraline (ZOLOFT) 100 MG tablet Take 100 mg by mouth daily.      SIMPESSE 0.15-0.03 &0.01 MG tablet Take by mouth.     simvastatin (ZOCOR) 40 MG tablet Take 40 mg by mouth daily.      topiramate (TOPAMAX) 50 MG tablet Take 50 mg by mouth daily.      glipiZIDE (GLUCOTROL XL) 5 MG 24 hr tablet Take 5 mg by mouth  daily with breakfast.      ipratropium (ATROVENT HFA) 17 MCG/ACT inhaler Inhale 1 puff into the lungs every 6 (six) hours as needed for wheezing.      levothyroxine (SYNTHROID) 150 MCG tablet Take 1 tablet (150 mcg total) by mouth daily at 6 (six) AM. 30 tablet 3   VYVANSE 50 MG capsule Take 50 mg by mouth daily.  (Patient not taking: Reported on 08/24/2022)     No current facility-administered medications for this visit.    OBJECTIVE: Vitals:   08/24/22 1159  BP: 127/66  Pulse: 73  Resp: 18  SpO2: 100%     Body mass index is 32.39 kg/m.      General: Well-developed, well-nourished, no acute distress. Eyes: Pink conjunctiva, anicteric sclera. HEENT: Normocephalic, moist mucous membranes, clear oropharnyx. Lungs: Clear to auscultation bilaterally. Heart: Regular rate and rhythm. No rubs, murmurs, or gallops. Abdomen: Soft, nontender, nondistended. No organomegaly noted, normoactive bowel sounds. Musculoskeletal: No edema, cyanosis, or clubbing. Neuro: Alert, answering all questions appropriately. Cranial nerves grossly intact. Skin: No rashes or petechiae noted. Psych: Normal affect. Lymphatics: No cervical, calvicular, axillary or inguinal LAD.   LAB RESULTS:  Lab Results  Component Value Date   NA 138 06/19/2020   K 4.2 06/19/2020   CL 104 06/19/2020   CO2 26 06/19/2020   GLUCOSE 148 (H) 06/19/2020   BUN 9 06/19/2020   CREATININE 0.74 06/19/2020   CALCIUM 9.1 06/24/2020   ALBUMIN 3.4 (L) 06/24/2020   GFRNONAA >60 06/19/2020   GFRAA >60 01/30/2020    Lab Results  Component Value Date   WBC 7.7 08/24/2022   NEUTROABS 4.5 08/24/2022   HGB 9.1 (L) 08/24/2022   HCT 30.9 (L) 08/24/2022   MCV 68.4 (L) 08/24/2022   PLT 582 (H) 08/24/2022    No results found for: "TIBC", "FERRITIN", "IRONPCTSAT"   STUDIES: No results found.  ASSESSMENT AND PLAN:   Jordan Sutton is a 47 y.o. female with pmh of ADD, diabetes, asthma, depression, Graves' disease was referred to  hematology for iron deficiency anemia  # Microcytic anemia -Progressive.  Most recent labs showed hemoglobin 8.5, MCV 77 -Obtain labs as below -Discussed with the patient and aunt about IV Venofer weekly x 5.  Occasional side effects such as back pain and nausea was discussed.  Low but potential risk of anaphylactic reaction was discussed. -Patient is scheduled with Dr. Haig Prophet in March 2024 for colonoscopy and endoscopy to assess for bleeding source.  Orders Placed This Encounter  Procedures   CBC with Differential   Iron and TIBC   Ferritin   Vitamin B12   CBC with Differential/Platelet   Iron and TIBC   Ferritin   Schedule IV Venofer weekly x  5 RTC in 3 months for MD visit, labs  Patient expressed understanding and was in agreement with this plan. She also understands that She can call clinic at any time with any questions, concerns, or complaints.   I spent a total of 45 minutes reviewing chart data, face-to-face evaluation with the patient, counseling and coordination of care as detailed above.  Jane Canary, MD   08/24/2022 1:03 PM

## 2022-08-30 ENCOUNTER — Inpatient Hospital Stay: Payer: Medicare HMO

## 2022-08-30 VITALS — BP 134/70 | HR 62 | Temp 97.6°F | Resp 18

## 2022-08-30 DIAGNOSIS — D649 Anemia, unspecified: Secondary | ICD-10-CM

## 2022-08-30 DIAGNOSIS — D509 Iron deficiency anemia, unspecified: Secondary | ICD-10-CM | POA: Diagnosis not present

## 2022-08-30 MED ORDER — SODIUM CHLORIDE 0.9 % IV SOLN
Freq: Once | INTRAVENOUS | Status: AC
  Filled 2022-08-30: qty 250

## 2022-08-30 MED ORDER — SODIUM CHLORIDE 0.9 % IV SOLN
200.0000 mg | Freq: Once | INTRAVENOUS | Status: AC
  Administered 2022-08-30: 200 mg via INTRAVENOUS
  Filled 2022-08-30: qty 200

## 2022-09-03 MED FILL — Iron Sucrose Inj 20 MG/ML (Fe Equiv): INTRAVENOUS | Qty: 10 | Status: AC

## 2022-09-06 ENCOUNTER — Inpatient Hospital Stay: Payer: Medicare HMO

## 2022-09-06 VITALS — BP 120/62 | HR 62 | Temp 97.8°F | Resp 16

## 2022-09-06 DIAGNOSIS — D509 Iron deficiency anemia, unspecified: Secondary | ICD-10-CM | POA: Diagnosis not present

## 2022-09-06 DIAGNOSIS — D649 Anemia, unspecified: Secondary | ICD-10-CM

## 2022-09-06 MED ORDER — SODIUM CHLORIDE 0.9 % IV SOLN
Freq: Once | INTRAVENOUS | Status: AC
  Filled 2022-09-06: qty 250

## 2022-09-06 MED ORDER — SODIUM CHLORIDE 0.9 % IV SOLN
200.0000 mg | Freq: Once | INTRAVENOUS | Status: AC
  Administered 2022-09-06: 200 mg via INTRAVENOUS
  Filled 2022-09-06: qty 200

## 2022-09-06 NOTE — Patient Instructions (Signed)
Iron Sucrose Injection What is this medication? IRON SUCROSE (EYE ern SOO krose) treats low levels of iron (iron deficiency anemia) in people with kidney disease. Iron is a mineral that plays an important role in making red blood cells, which carry oxygen from your lungs to the rest of your body. This medicine may be used for other purposes; ask your health care provider or pharmacist if you have questions. COMMON BRAND NAME(S): Venofer What should I tell my care team before I take this medication? They need to know if you have any of these conditions: Anemia not caused by low iron levels Heart disease High levels of iron in the blood Kidney disease Liver disease An unusual or allergic reaction to iron, other medications, foods, dyes, or preservatives Pregnant or trying to get pregnant Breastfeeding How should I use this medication? This medication is for infusion into a vein. It is given in a hospital or clinic setting. Talk to your care team about the use of this medication in children. While this medication may be prescribed for children as young as 2 years for selected conditions, precautions do apply. Overdosage: If you think you have taken too much of this medicine contact a poison control center or emergency room at once. NOTE: This medicine is only for you. Do not share this medicine with others. What if I miss a dose? Keep appointments for follow-up doses. It is important not to miss your dose. Call your care team if you are unable to keep an appointment. What may interact with this medication? Do not take this medication with any of the following: Deferoxamine Dimercaprol Other iron products This medication may also interact with the following: Chloramphenicol Deferasirox This list may not describe all possible interactions. Give your health care provider a list of all the medicines, herbs, non-prescription drugs, or dietary supplements you use. Also tell them if you smoke,  drink alcohol, or use illegal drugs. Some items may interact with your medicine. What should I watch for while using this medication? Visit your care team regularly. Tell your care team if your symptoms do not start to get better or if they get worse. You may need blood work done while you are taking this medication. You may need to follow a special diet. Talk to your care team. Foods that contain iron include: whole grains/cereals, dried fruits, beans, or peas, leafy green vegetables, and organ meats (liver, kidney). What side effects may I notice from receiving this medication? Side effects that you should report to your care team as soon as possible: Allergic reactions--skin rash, itching, hives, swelling of the face, lips, tongue, or throat Low blood pressure--dizziness, feeling faint or lightheaded, blurry vision Shortness of breath Side effects that usually do not require medical attention (report to your care team if they continue or are bothersome): Flushing Headache Joint pain Muscle pain Nausea Pain, redness, or irritation at injection site This list may not describe all possible side effects. Call your doctor for medical advice about side effects. You may report side effects to FDA at 1-800-FDA-1088. Where should I keep my medication? This medication is given in a hospital or clinic and will not be stored at home. NOTE: This sheet is a summary. It may not cover all possible information. If you have questions about this medicine, talk to your doctor, pharmacist, or health care provider.  2023 Elsevier/Gold Standard (2020-11-20 00:00:00)

## 2022-09-13 ENCOUNTER — Inpatient Hospital Stay: Payer: Medicare HMO

## 2022-09-13 VITALS — BP 129/65 | HR 60 | Temp 97.4°F | Resp 16

## 2022-09-13 DIAGNOSIS — D649 Anemia, unspecified: Secondary | ICD-10-CM

## 2022-09-13 DIAGNOSIS — D509 Iron deficiency anemia, unspecified: Secondary | ICD-10-CM | POA: Diagnosis not present

## 2022-09-13 MED ORDER — SODIUM CHLORIDE 0.9 % IV SOLN
Freq: Once | INTRAVENOUS | Status: AC
  Filled 2022-09-13: qty 250

## 2022-09-13 MED ORDER — SODIUM CHLORIDE 0.9 % IV SOLN
200.0000 mg | Freq: Once | INTRAVENOUS | Status: AC
  Administered 2022-09-13: 200 mg via INTRAVENOUS
  Filled 2022-09-13: qty 200

## 2022-09-20 ENCOUNTER — Inpatient Hospital Stay: Payer: Medicare HMO

## 2022-09-20 VITALS — BP 134/70 | HR 56 | Temp 97.9°F | Resp 16

## 2022-09-20 DIAGNOSIS — D649 Anemia, unspecified: Secondary | ICD-10-CM

## 2022-09-20 DIAGNOSIS — D509 Iron deficiency anemia, unspecified: Secondary | ICD-10-CM | POA: Diagnosis not present

## 2022-09-20 MED ORDER — SODIUM CHLORIDE 0.9 % IV SOLN
200.0000 mg | Freq: Once | INTRAVENOUS | Status: AC
  Administered 2022-09-20: 200 mg via INTRAVENOUS
  Filled 2022-09-20: qty 200

## 2022-09-20 MED ORDER — SODIUM CHLORIDE 0.9 % IV SOLN
Freq: Once | INTRAVENOUS | Status: AC
  Filled 2022-09-20: qty 250

## 2022-09-20 NOTE — Patient Instructions (Signed)
Iron Sucrose Injection What is this medication? IRON SUCROSE (EYE ern SOO krose) treats low levels of iron (iron deficiency anemia) in people with kidney disease. Iron is a mineral that plays an important role in making red blood cells, which carry oxygen from your lungs to the rest of your body. This medicine may be used for other purposes; ask your health care provider or pharmacist if you have questions. COMMON BRAND NAME(S): Venofer What should I tell my care team before I take this medication? They need to know if you have any of these conditions: Anemia not caused by low iron levels Heart disease High levels of iron in the blood Kidney disease Liver disease An unusual or allergic reaction to iron, other medications, foods, dyes, or preservatives Pregnant or trying to get pregnant Breastfeeding How should I use this medication? This medication is for infusion into a vein. It is given in a hospital or clinic setting. Talk to your care team about the use of this medication in children. While this medication may be prescribed for children as young as 2 years for selected conditions, precautions do apply. Overdosage: If you think you have taken too much of this medicine contact a poison control center or emergency room at once. NOTE: This medicine is only for you. Do not share this medicine with others. What if I miss a dose? Keep appointments for follow-up doses. It is important not to miss your dose. Call your care team if you are unable to keep an appointment. What may interact with this medication? Do not take this medication with any of the following: Deferoxamine Dimercaprol Other iron products This medication may also interact with the following: Chloramphenicol Deferasirox This list may not describe all possible interactions. Give your health care provider a list of all the medicines, herbs, non-prescription drugs, or dietary supplements you use. Also tell them if you smoke,  drink alcohol, or use illegal drugs. Some items may interact with your medicine. What should I watch for while using this medication? Visit your care team regularly. Tell your care team if your symptoms do not start to get better or if they get worse. You may need blood work done while you are taking this medication. You may need to follow a special diet. Talk to your care team. Foods that contain iron include: whole grains/cereals, dried fruits, beans, or peas, leafy green vegetables, and organ meats (liver, kidney). What side effects may I notice from receiving this medication? Side effects that you should report to your care team as soon as possible: Allergic reactions--skin rash, itching, hives, swelling of the face, lips, tongue, or throat Low blood pressure--dizziness, feeling faint or lightheaded, blurry vision Shortness of breath Side effects that usually do not require medical attention (report to your care team if they continue or are bothersome): Flushing Headache Joint pain Muscle pain Nausea Pain, redness, or irritation at injection site This list may not describe all possible side effects. Call your doctor for medical advice about side effects. You may report side effects to FDA at 1-800-FDA-1088. Where should I keep my medication? This medication is given in a hospital or clinic and will not be stored at home. NOTE: This sheet is a summary. It may not cover all possible information. If you have questions about this medicine, talk to your doctor, pharmacist, or health care provider.  2023 Elsevier/Gold Standard (2020-11-20 00:00:00)

## 2022-09-27 ENCOUNTER — Inpatient Hospital Stay: Payer: Medicare HMO | Attending: Internal Medicine

## 2022-09-27 VITALS — BP 117/65 | HR 62 | Temp 97.8°F | Resp 17

## 2022-09-27 DIAGNOSIS — D509 Iron deficiency anemia, unspecified: Secondary | ICD-10-CM | POA: Insufficient documentation

## 2022-09-27 DIAGNOSIS — D649 Anemia, unspecified: Secondary | ICD-10-CM

## 2022-09-27 MED ORDER — SODIUM CHLORIDE 0.9 % IV SOLN
Freq: Once | INTRAVENOUS | Status: AC
  Filled 2022-09-27: qty 250

## 2022-09-27 MED ORDER — SODIUM CHLORIDE 0.9 % IV SOLN
200.0000 mg | Freq: Once | INTRAVENOUS | Status: AC
  Administered 2022-09-27: 200 mg via INTRAVENOUS
  Filled 2022-09-27: qty 200

## 2022-09-27 MED ORDER — SODIUM CHLORIDE 0.9% FLUSH
10.0000 mL | Freq: Once | INTRAVENOUS | Status: AC | PRN
  Administered 2022-09-27: 10 mL
  Filled 2022-09-27: qty 10

## 2022-09-27 NOTE — Patient Instructions (Signed)

## 2022-09-27 NOTE — Progress Notes (Signed)
Patient tolerated Venofer infusion well, no questions/concerns voiced. Monitored 30 min post transfusion. Patient stable at discharge. VSS. AVS given.    

## 2022-11-19 ENCOUNTER — Inpatient Hospital Stay: Payer: Medicare HMO | Admitting: Internal Medicine

## 2022-11-19 ENCOUNTER — Inpatient Hospital Stay: Payer: Medicare HMO

## 2022-11-29 ENCOUNTER — Inpatient Hospital Stay (HOSPITAL_BASED_OUTPATIENT_CLINIC_OR_DEPARTMENT_OTHER): Payer: Medicare HMO | Admitting: Internal Medicine

## 2022-11-29 ENCOUNTER — Inpatient Hospital Stay: Payer: Medicare HMO | Attending: Internal Medicine

## 2022-11-29 VITALS — BP 141/80 | HR 57 | Temp 96.7°F | Resp 15 | Wt 222.9 lb

## 2022-11-29 DIAGNOSIS — F32A Depression, unspecified: Secondary | ICD-10-CM | POA: Diagnosis not present

## 2022-11-29 DIAGNOSIS — D649 Anemia, unspecified: Secondary | ICD-10-CM

## 2022-11-29 DIAGNOSIS — J45909 Unspecified asthma, uncomplicated: Secondary | ICD-10-CM | POA: Diagnosis not present

## 2022-11-29 DIAGNOSIS — E0500 Thyrotoxicosis with diffuse goiter without thyrotoxic crisis or storm: Secondary | ICD-10-CM | POA: Diagnosis not present

## 2022-11-29 DIAGNOSIS — D508 Other iron deficiency anemias: Secondary | ICD-10-CM

## 2022-11-29 DIAGNOSIS — D509 Iron deficiency anemia, unspecified: Secondary | ICD-10-CM | POA: Diagnosis present

## 2022-11-29 DIAGNOSIS — E119 Type 2 diabetes mellitus without complications: Secondary | ICD-10-CM | POA: Insufficient documentation

## 2022-11-29 LAB — CBC WITH DIFFERENTIAL/PLATELET
Abs Immature Granulocytes: 0.03 10*3/uL (ref 0.00–0.07)
Basophils Absolute: 0.1 10*3/uL (ref 0.0–0.1)
Basophils Relative: 1 %
Eosinophils Absolute: 0.5 10*3/uL (ref 0.0–0.5)
Eosinophils Relative: 5 %
HCT: 42.3 % (ref 36.0–46.0)
Hemoglobin: 13.4 g/dL (ref 12.0–15.0)
Immature Granulocytes: 0 %
Lymphocytes Relative: 18 %
Lymphs Abs: 1.8 10*3/uL (ref 0.7–4.0)
MCH: 26.9 pg (ref 26.0–34.0)
MCHC: 31.7 g/dL (ref 30.0–36.0)
MCV: 84.9 fL (ref 80.0–100.0)
Monocytes Absolute: 0.8 10*3/uL (ref 0.1–1.0)
Monocytes Relative: 8 %
Neutro Abs: 6.8 10*3/uL (ref 1.7–7.7)
Neutrophils Relative %: 68 %
Platelets: 291 10*3/uL (ref 150–400)
RBC: 4.98 MIL/uL (ref 3.87–5.11)
RDW: 18.5 % — ABNORMAL HIGH (ref 11.5–15.5)
WBC: 9.9 10*3/uL (ref 4.0–10.5)
nRBC: 0 % (ref 0.0–0.2)

## 2022-11-29 LAB — IRON AND TIBC
Iron: 89 ug/dL (ref 28–170)
Saturation Ratios: 26 % (ref 10.4–31.8)
TIBC: 344 ug/dL (ref 250–450)
UIBC: 255 ug/dL

## 2022-11-29 LAB — FERRITIN: Ferritin: 36 ng/mL (ref 11–307)

## 2022-11-29 NOTE — Progress Notes (Signed)
[facility]  [phone] Fax:(336) [phone]  ID: Jordan Sutton OB: 1975-08-28  MR#: [id number]  AOZ#:[id number]  Patient Care Team: Rayetta Humphrey, MD as PCP - General (Family Medicine)   HPI: Jordan Sutton is a 47 y.o. female with past medical history of ADD, diabetes, asthma, depression, Graves' disease was referred to hematology for iron deficiency anemia  Patient accompanied today with her and who provided most of the information since patient has intellectual disability.  She follows with Dr. Greggory Stallion.  Routine lab work showed hemoglobin of 8.5, MCV 77, WBC and platelets normal.  She has been feeling well.  Denies any NSAIDs or gastric bypass surgery.  Not sure if there is any blood in stools or urine or black tarry stools. Has heavy menstrual period.   Completed 5 infusions of IV Venofer in February 2024. She was scheduled to have endoscopy and colonoscopy with Dr. Mia Creek in March 2024.  Had cold so now is rescheduled to July.  Interval history- Patient was seen today accompanied by caregiver for follow-up labs, post iron infusions Patient has been doing well overall.  Denies any new concerns since last visit.  Denies any bleeding in urine or stool.  Tolerated iron infusions well.  Denies any improvement in her energy level.  REVIEW OF SYSTEMS:   ROS  As per HPI. Otherwise, a complete review of systems is negative.  PAST MEDICAL HISTORY: Past Medical History:  Diagnosis Date   Asthma    Depression    Diabetes mellitus without complication (HCC)    Graves' disease with exophthalmos    Hypertension    Intellectual disability    NASH (nonalcoholic steatohepatitis)    Obesity     PAST SURGICAL HISTORY: Past Surgical History:  Procedure Laterality Date   THYROIDECTOMY N/A 06/23/2020   Procedure: THYROIDECTOMY, total;  Surgeon: Duanne Guess, MD;  Location: [facility] ORS;  Service: General;  Laterality: N/A;    FAMILY HISTORY: Family  History  Problem Relation Age of Onset   Alcohol abuse Mother    Hypertension Father     HEALTH MAINTENANCE: Social History   Tobacco Use   Smoking status: Never   Smokeless tobacco: Never  Vaping Use   Vaping Use: Never used  Substance Use Topics   Alcohol use: Not Currently   Drug use: Never     No Known Allergies  Current Outpatient Medications  Medication Sig Dispense Refill   polyethylene glycol-electrolytes (NULYTELY) 420 g solution Take 4,000 mLs by mouth once.     albuterol (VENTOLIN HFA) 108 (90 Base) MCG/ACT inhaler Inhale 1-2 puffs into the lungs every 6 (six) hours as needed for wheezing or shortness of breath.      amphetamine-dextroamphetamine (ADDERALL XR) 20 MG 24 hr capsule Take 20 mg by mouth daily.      calcium carbonate (OS-CAL - DOSED IN MG OF ELEMENTAL CALCIUM) 1250 (500 Ca) MG tablet Take 2 tablets (1,000 mg of elemental calcium total) by mouth 3 (three) times daily with meals. Take TWO tablets (1000mg ) THREE times daily for 1 week. After that week, take ONE tablet (500mg ) THREE times daily until your follow up appointment 120 tablet 1   fluticasone (FLONASE) 50 MCG/ACT nasal spray Place into the nose.     gabapentin (NEURONTIN) 100 MG capsule Take 100 mg by mouth at bedtime.      glipiZIDE (GLUCOTROL XL) 5 MG 24 hr tablet Take 5 mg by mouth daily with breakfast.      haloperidol (HALDOL) 2  MG tablet Take 2 mg by mouth in the morning.      hydrochlorothiazide (HYDRODIURIL) 25 MG tablet Take 25 mg by mouth daily.     ibuprofen (ADVIL) 600 MG tablet Take 1 tablet (600 mg total) by mouth every 6 (six) hours as needed (for mild pain not relieved by other medications.). 30 tablet 0   ipratropium (ATROVENT HFA) 17 MCG/ACT inhaler Inhale 1 puff into the lungs every 6 (six) hours as needed for wheezing.      ipratropium (ATROVENT) 0.02 % nebulizer solution Take by nebulization.     levothyroxine (SYNTHROID) 150 MCG tablet Take 1 tablet (150 mcg total) by mouth daily  at 6 (six) AM. 30 tablet 3   losartan (COZAAR) 100 MG tablet Take 100 mg by mouth daily.     metFORMIN (GLUCOPHAGE) 1000 MG tablet Take 1,000 mg by mouth 2 (two) times daily with a meal.      omeprazole (PRILOSEC) 20 MG capsule Take by mouth.     sertraline (ZOLOFT) 100 MG tablet Take 100 mg by mouth daily.      SIMPESSE 0.15-0.03 &0.01 MG tablet Take by mouth.     simvastatin (ZOCOR) 40 MG tablet Take 40 mg by mouth daily.      topiramate (TOPAMAX) 50 MG tablet Take 50 mg by mouth daily.      VYVANSE 50 MG capsule Take 50 mg by mouth daily.  (Patient not taking: Reported on 08/24/2022)     No current facility-administered medications for this visit.    OBJECTIVE: Vitals:   11/29/22 1332  BP: (!) 141/80  Pulse: (!) 57  Resp: 15  Temp: (!) 96.7 F (35.9 C)  SpO2: 100%     Body mass index is 33.89 kg/m.      General: Well-developed, well-nourished, no acute distress. Eyes: Pink conjunctiva, anicteric sclera. HEENT: Normocephalic, moist mucous membranes, clear oropharnyx. Lungs: Clear to auscultation bilaterally. Heart: Regular rate and rhythm. No rubs, murmurs, or gallops. Abdomen: Soft, nontender, nondistended. No organomegaly noted, normoactive bowel sounds. Musculoskeletal: No edema, cyanosis, or clubbing. Neuro: Alert, answering all questions appropriately. Cranial nerves grossly intact. Skin: No rashes or petechiae noted. Psych: Normal affect. Lymphatics: No cervical, calvicular, axillary or inguinal LAD.   LAB RESULTS:  Lab Results  Component Value Date   NA 138 06/19/2020   K 4.2 06/19/2020   CL 104 06/19/2020   CO2 26 06/19/2020   GLUCOSE 148 (H) 06/19/2020   BUN 9 06/19/2020   CREATININE 0.74 06/19/2020   CALCIUM 9.1 06/24/2020   ALBUMIN 3.4 (L) 06/24/2020   GFRNONAA >60 06/19/2020   GFRAA >60 01/30/2020    Lab Results  Component Value Date   WBC 9.9 11/29/2022   NEUTROABS 6.8 11/29/2022   HGB 13.4 11/29/2022   HCT 42.3 11/29/2022   MCV 84.9  11/29/2022   PLT 291 11/29/2022    Lab Results  Component Value Date   TIBC 529 (H) 08/24/2022   FERRITIN 5 (L) 08/24/2022   IRONPCTSAT 4 (L) 08/24/2022     STUDIES: No results found.  ASSESSMENT AND PLAN:   Jordan Sutton is a 47 y.o. female with pmh of ADD, diabetes, asthma, depression, Graves' disease was referred to hematology for iron deficiency anemia  # Iron deficiency anemia -Previous labs showed hemoglobin 8.5, MCV 77.  Completed 5 doses of IV Venofer in February 2024.  Repeat hemoglobin improved to 13.4.  Iron panel is pending.  -Patient was scheduled with Dr. Mia Creek for colonoscopy and endoscopy in  March 2024.  However she developed a cold and now is rescheduled to July to assess for any bleeding source.   Orders Placed This Encounter  Procedures   CBC with Differential/Platelet   Iron and TIBC   Ferritin   Vitamin B12   RTC in 6 months for MD visit, labs, possible Venofer  Patient expressed understanding and was in agreement with this plan. She also understands that She can call clinic at any time with any questions, concerns, or complaints.   I spent a total of 25 minutes reviewing chart data, face-to-face evaluation with the patient, counseling and coordination of care as detailed above.  Michaelyn BarterKavita Shawndell Schillaci, MD   11/29/2022 1:56 PM

## 2023-01-13 IMAGING — MG MM DIGITAL SCREENING BILAT W/ TOMO AND CAD
8 of 14 series · 8 of 40 positions shown · non-contrast
Comparison: Previous exam(s).

CLINICAL DATA: Screening.

EXAM:
DIGITAL SCREENING BILATERAL MAMMOGRAM WITH TOMOSYNTHESIS AND CAD
TECHNIQUE: Bilateral screening digital craniocaudal and mediolateral oblique
mammograms were obtained. Bilateral screening digital breast
tomosynthesis was performed. The images were evaluated with
computer-aided detection.

[L MLO synth-2D (1 of 2)]
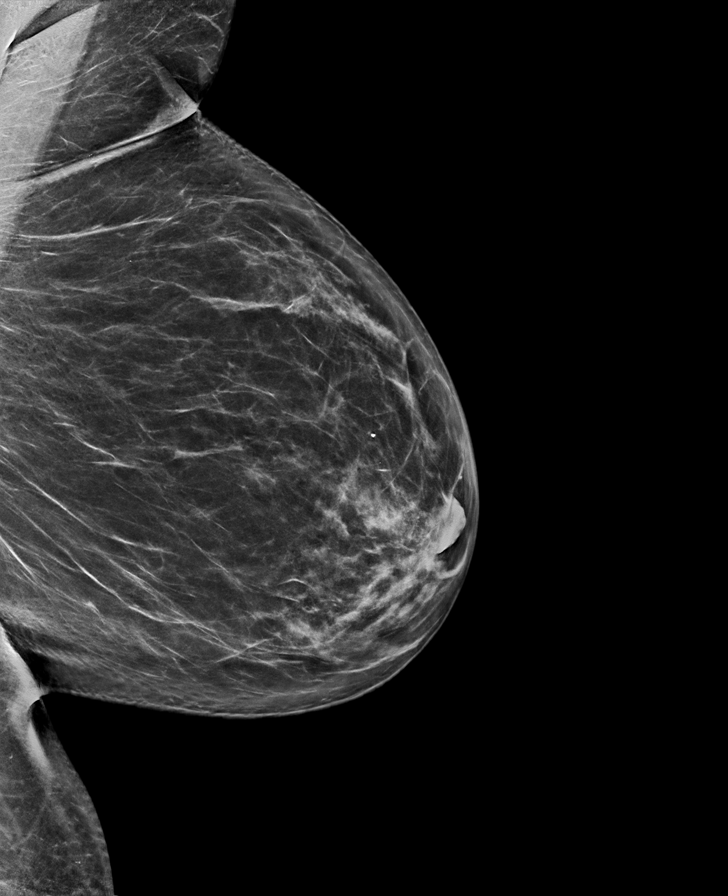

[R MLO synth-2D]
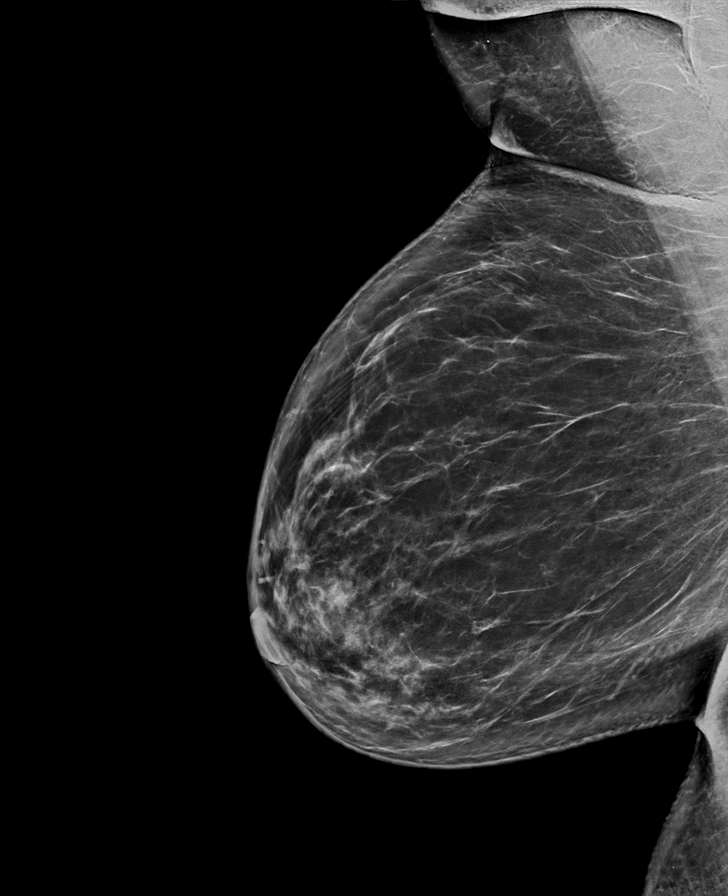

[R XCCL synth-2D]
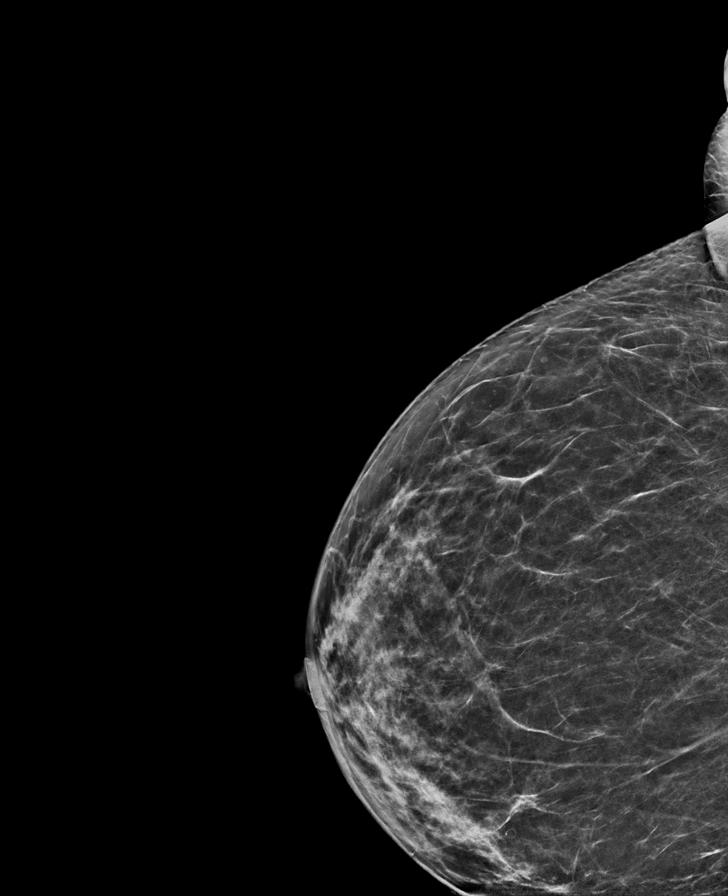

[L CC synth-2D]
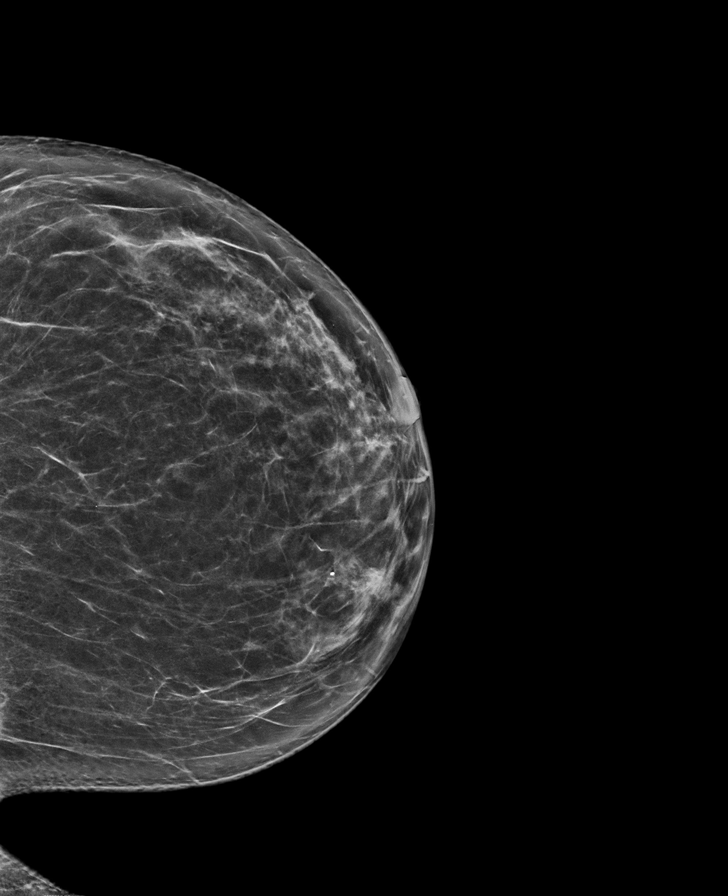

[L MLO synth-2D (2 of 2)]
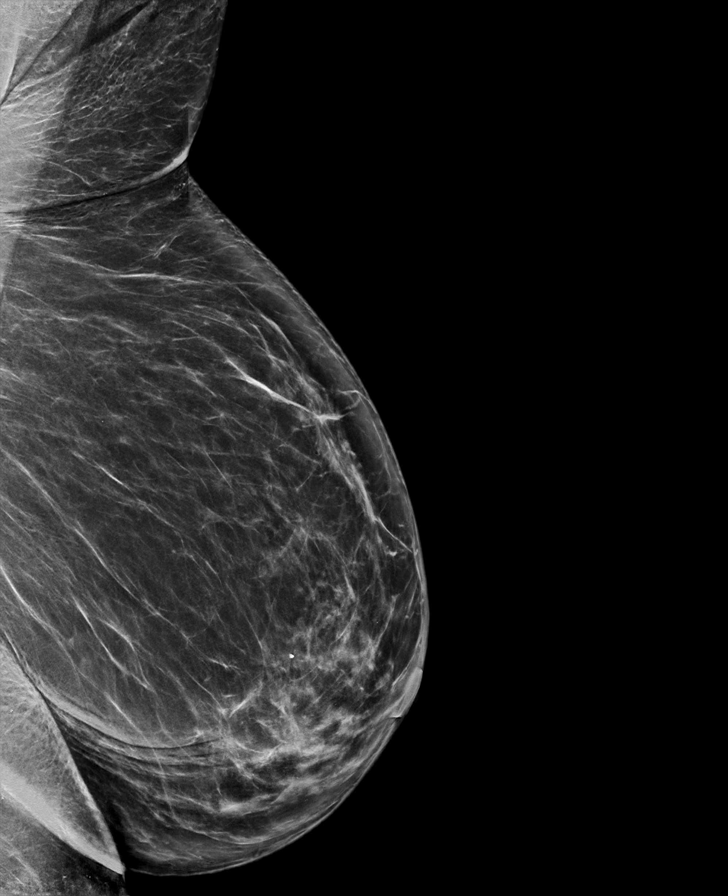

[R CC synth-2D]
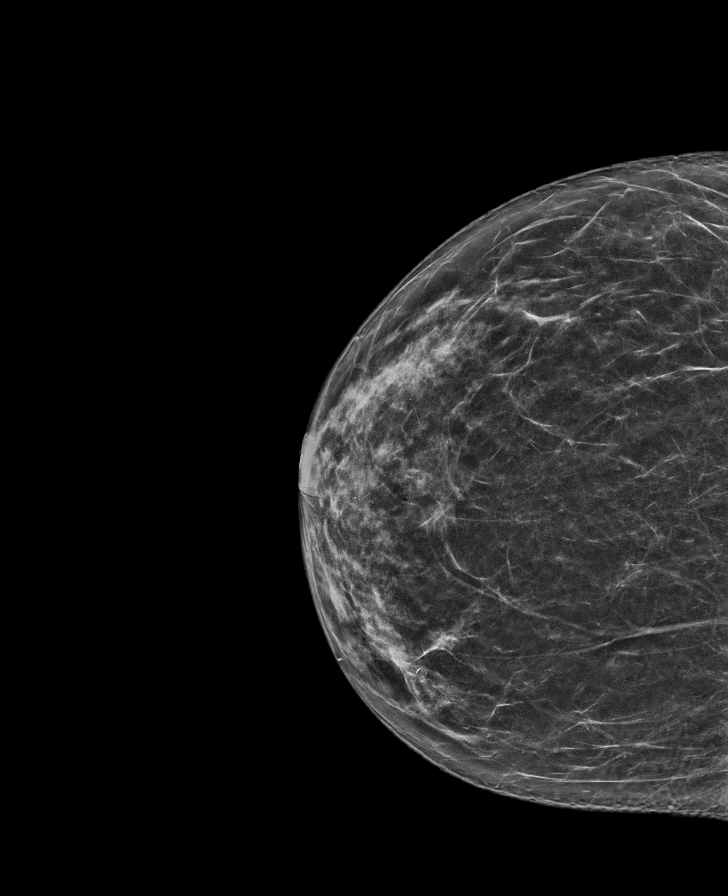

[L XCCL synth-2D]
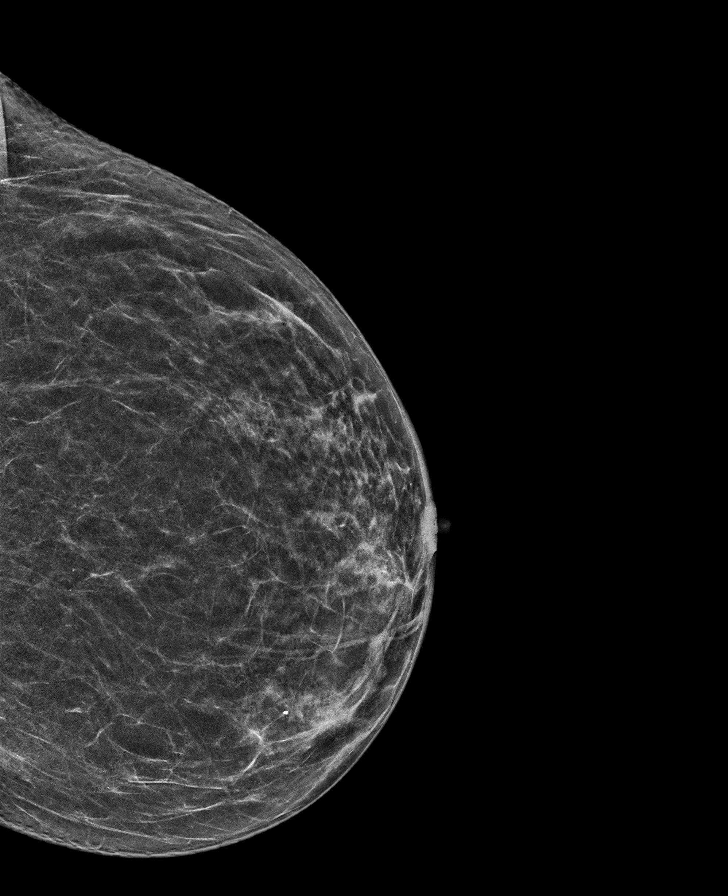

[L MLO tomo · tomo slice 43/86.0]
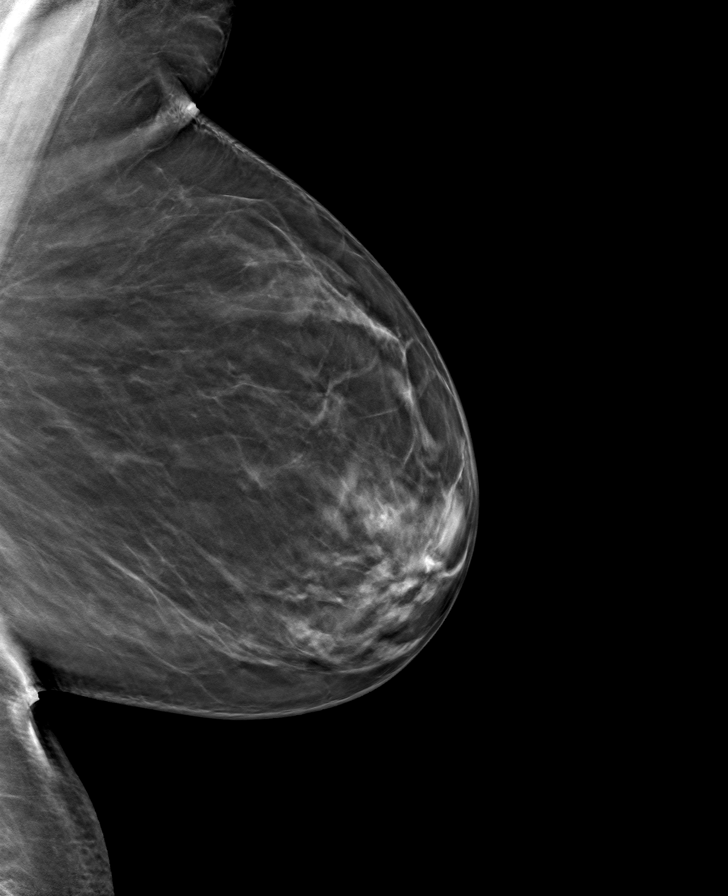

[8 of 40 positions shown; findings below may reference images not displayed]

ACR Breast Density Category b: There are scattered areas of
fibroglandular density.
FINDINGS: There are no findings suspicious for malignancy. The images were
evaluated with computer-aided detection.
IMPRESSION: No mammographic evidence of malignancy. A result letter of this
screening mammogram will be mailed directly to the patient.

RECOMMENDATION:
Screening mammogram in one year. (Code:[id number]-[id number])

BI-RADS CATEGORY  1: Negative.

## 2023-01-22 ENCOUNTER — Encounter: Payer: Self-pay | Admitting: Internal Medicine

## 2023-01-25 ENCOUNTER — Encounter: Payer: Self-pay | Admitting: Internal Medicine

## 2023-02-21 ENCOUNTER — Other Ambulatory Visit: Payer: Self-pay

## 2023-02-21 ENCOUNTER — Encounter
Admission: RE | Disposition: A | Discharge status: Home / Self Care | Payer: Self-pay | Source: Home / Self Care | Attending: Gastroenterology

## 2023-02-21 ENCOUNTER — Encounter: Payer: Self-pay | Admitting: *Deleted

## 2023-02-21 ENCOUNTER — Ambulatory Visit: Payer: Medicare HMO | Admitting: Anesthesiology

## 2023-02-21 ENCOUNTER — Ambulatory Visit
Admission: RE | Admit: 2023-02-21 | Discharge: 2023-02-21 | Disposition: A | Discharge status: Home / Self Care | Payer: Medicare HMO | Attending: Gastroenterology | Admitting: Gastroenterology

## 2023-02-21 DIAGNOSIS — K7581 Nonalcoholic steatohepatitis (NASH): Secondary | ICD-10-CM | POA: Insufficient documentation

## 2023-02-21 DIAGNOSIS — E669 Obesity, unspecified: Secondary | ICD-10-CM | POA: Diagnosis not present

## 2023-02-21 DIAGNOSIS — F32A Depression, unspecified: Secondary | ICD-10-CM | POA: Insufficient documentation

## 2023-02-21 DIAGNOSIS — K449 Diaphragmatic hernia without obstruction or gangrene: Secondary | ICD-10-CM | POA: Diagnosis not present

## 2023-02-21 DIAGNOSIS — E119 Type 2 diabetes mellitus without complications: Secondary | ICD-10-CM | POA: Insufficient documentation

## 2023-02-21 DIAGNOSIS — D509 Iron deficiency anemia, unspecified: Secondary | ICD-10-CM | POA: Insufficient documentation

## 2023-02-21 DIAGNOSIS — J45909 Unspecified asthma, uncomplicated: Secondary | ICD-10-CM | POA: Diagnosis not present

## 2023-02-21 DIAGNOSIS — F79 Unspecified intellectual disabilities: Secondary | ICD-10-CM | POA: Diagnosis not present

## 2023-02-21 DIAGNOSIS — Z7984 Long term (current) use of oral hypoglycemic drugs: Secondary | ICD-10-CM | POA: Insufficient documentation

## 2023-02-21 DIAGNOSIS — I10 Essential (primary) hypertension: Secondary | ICD-10-CM | POA: Diagnosis not present

## 2023-02-21 DIAGNOSIS — Z79899 Other long term (current) drug therapy: Secondary | ICD-10-CM | POA: Insufficient documentation

## 2023-02-21 DIAGNOSIS — E890 Postprocedural hypothyroidism: Secondary | ICD-10-CM | POA: Insufficient documentation

## 2023-02-21 DIAGNOSIS — Z6833 Body mass index (BMI) 33.0-33.9, adult: Secondary | ICD-10-CM | POA: Diagnosis not present

## 2023-02-21 HISTORY — PX: COLONOSCOPY WITH PROPOFOL: SHX5780

## 2023-02-21 HISTORY — PX: BIOPSY: SHX5522

## 2023-02-21 HISTORY — PX: ESOPHAGOGASTRODUODENOSCOPY (EGD) WITH PROPOFOL: SHX5813

## 2023-02-21 LAB — POCT PREGNANCY, URINE: Preg Test, Ur: NEGATIVE

## 2023-02-21 SURGERY — COLONOSCOPY WITH PROPOFOL
Anesthesia: General

## 2023-02-21 MED ORDER — PROPOFOL 500 MG/50ML IV EMUL
INTRAVENOUS | Status: DC | PRN
  Administered 2023-02-21: 150 ug/kg/min via INTRAVENOUS

## 2023-02-21 MED ORDER — LIDOCAINE HCL (PF) 2 % IJ SOLN
INTRAMUSCULAR | Status: DC | PRN
  Administered 2023-02-21: 40 mg via INTRADERMAL

## 2023-02-21 MED ORDER — SODIUM CHLORIDE 0.9 % IV SOLN: INTRAVENOUS | Status: DC

## 2023-02-21 MED ORDER — PROPOFOL 10 MG/ML IV BOLUS
INTRAVENOUS | Status: AC
  Filled 2023-02-21: qty 20

## 2023-02-21 NOTE — Anesthesia Preprocedure Evaluation (Signed)
Anesthesia Evaluation  Patient identified by MRN, date of birth, ID band Patient awake    Reviewed: Allergy & Precautions, NPO status , Patient's Chart, lab work & pertinent test results  History of Anesthesia Complications Negative for: history of anesthetic complications  Airway Mallampati: III  TM Distance: >3 FB Neck ROM: full    Dental  (+) Poor Dentition   Pulmonary asthma    Pulmonary exam normal        Cardiovascular hypertension, On Medications negative cardio ROS Normal cardiovascular exam     Neuro/Psych  PSYCHIATRIC DISORDERS  Depression    negative neurological ROS     GI/Hepatic negative GI ROS,,,(+) Hepatitis - (NASH)  Endo/Other  diabetes, Poorly Controlled, Type 2Hypothyroidism    Renal/GU negative Renal ROS  negative genitourinary   Musculoskeletal   Abdominal   Peds  Hematology  (+) Blood dyscrasia, anemia   Anesthesia Other Findings Past Medical History: No date: Asthma No date: Depression No date: Diabetes mellitus without complication (HCC) No date: Graves' disease with exophthalmos No date: Hypertension No date: Intellectual disability No date: NASH (nonalcoholic steatohepatitis) No date: Obesity  Past Surgical History: 06/23/2020: THYROIDECTOMY; N/A     Comment:  Procedure: THYROIDECTOMY, total;  Surgeon: Duanne Guess, MD;  Location: [facility] ORS;  Service: General;                Laterality: N/A;  BMI    Body Mass Index: 33.49 kg/m      Reproductive/Obstetrics negative OB ROS                              Anesthesia Physical Anesthesia Plan  ASA: 3  Anesthesia Plan: General   Post-op Pain Management: Minimal or no pain anticipated   Induction: Intravenous  PONV Risk Score and Plan: Propofol infusion and TIVA  Airway Management Planned: Natural Airway and Nasal Cannula  Additional Equipment:   Intra-op Plan:    Post-operative Plan:   Informed Consent: I have reviewed the patients History and Physical, chart, labs and discussed the procedure including the risks, benefits and alternatives for the proposed anesthesia with the patient or authorized representative who has indicated his/her understanding and acceptance.     Dental Advisory Given  Plan Discussed with: Anesthesiologist, CRNA and Surgeon  Anesthesia Plan Comments: (Patient consented for risks of anesthesia including but not limited to:  - adverse reactions to medications - risk of airway placement if required - damage to eyes, teeth, lips or other oral mucosa - nerve damage due to positioning  - sore throat or hoarseness - Damage to heart, brain, nerves, lungs, other parts of body or loss of life  Patient voiced understanding.)         Anesthesia Quick Evaluation

## 2023-02-21 NOTE — Op Note (Signed)
[facility] Gastroenterology Patient Name: Jordan Sutton Procedure Date: 02/21/2023 8:16 AM MRN: [id number] Account #: [id number] Date of Birth: November 20, 1975 Admit Type: Outpatient Age: 47 Room: [facility] ENDO ROOM 3 Gender: Female Note Status: Finalized Instrument Name: Upper Endoscope [id number] Procedure:             Upper GI endoscopy Indications:           Iron deficiency anemia Providers:             Eather Colas MD, MD Referring MD:          Marylin Crosby. Greggory Stallion MD, MD (Referring MD) Medicines:             Monitored Anesthesia Care Complications:         No immediate complications. Estimated blood loss:                         Minimal. Procedure:             Pre-Anesthesia Assessment:                        - Prior to the procedure, a History and Physical was                         performed, and patient medications and allergies were                         reviewed. The patient is competent. The risks and                         benefits of the procedure and the sedation options and                         risks were discussed with the patient. All questions                         were answered and informed consent was obtained.                         Patient identification and proposed procedure were                         verified by the physician, the nurse, the                         anesthesiologist, the anesthetist and the technician                         in the endoscopy suite. Mental Status Examination:                         alert and oriented. Airway Examination: normal                         oropharyngeal airway and neck mobility. Respiratory                         Examination: clear to auscultation. CV Examination:  normal. Prophylactic Antibiotics: The patient does not                         require prophylactic antibiotics. Prior                         Anticoagulants: The patient has taken no anticoagulant                          or antiplatelet agents. ASA Grade Assessment: III - A                         patient with severe systemic disease. After reviewing                         the risks and benefits, the patient was deemed in                         satisfactory condition to undergo the procedure. The                         anesthesia plan was to use monitored anesthesia care                         (MAC). Immediately prior to administration of                         medications, the patient was re-assessed for adequacy                         to receive sedatives. The heart rate, respiratory                         rate, oxygen saturations, blood pressure, adequacy of                         pulmonary ventilation, and response to care were                         monitored throughout the procedure. The physical                         status of the patient was re-assessed after the                         procedure.                        After obtaining informed consent, the endoscope was                         passed under direct vision. Throughout the procedure,                         the patient's blood pressure, pulse, and oxygen                         saturations were monitored continuously. The Endoscope  was introduced through the mouth, and advanced to the                         second part of duodenum. The upper GI endoscopy was                         accomplished without difficulty. The patient tolerated                         the procedure well. Findings:      A small hiatal hernia was present.      The exam of the esophagus was otherwise normal.      The entire examined stomach was normal. Biopsies were taken with a cold       forceps for Helicobacter pylori testing. Estimated blood loss was       minimal.      The examined duodenum was normal. Impression:            - Small hiatal hernia.                        - Normal stomach. Biopsied.                         - Normal examined duodenum. Recommendation:        - Discharge patient to home.                        - Resume previous diet.                        - Continue present medications.                        - Await pathology results.                        - Return to referring physician as previously                         scheduled. Procedure Code(s):     --- Professional ---                        [phone], Esophagogastroduodenoscopy, flexible,                         transoral; with biopsy, single or multiple Diagnosis Code(s):     --- Professional ---                        K44.9, Diaphragmatic hernia without obstruction or                         gangrene                        D50.9, Iron deficiency anemia, unspecified CPT copyright 2022 American Medical Association. All rights reserved. The codes documented in this report are preliminary and upon coder review may  be revised to meet current compliance requirements. Eather Colas MD, MD 02/21/2023 8:46:54 AM Number of Addenda: 0 Note Initiated On: 02/21/2023 8:16 AM Estimated Blood Loss:  Estimated blood loss was minimal.      [facility]

## 2023-02-21 NOTE — Interval H&P Note (Signed)
History and Physical Interval Note:  02/21/2023 8:28 AM  Jordan Sutton  has presented today for surgery, with the diagnosis of ida.  The various methods of treatment have been discussed with the patient and family. After consideration of risks, benefits and other options for treatment, the patient has consented to  Procedure(s): COLONOSCOPY WITH PROPOFOL (N/A) ESOPHAGOGASTRODUODENOSCOPY (EGD) WITH PROPOFOL (N/A) as a surgical intervention.  The patient's history has been reviewed, patient examined, no change in status, stable for surgery.  I have reviewed the patient's chart and labs.  Questions were answered to the patient's satisfaction.     Regis Bill  Ok to proceed with EGD/Colonoscopy

## 2023-02-21 NOTE — Transfer of Care (Signed)
Immediate Anesthesia Transfer of Care Note  Patient: Jordan Sutton  Procedure(s) Performed: COLONOSCOPY WITH PROPOFOL ESOPHAGOGASTRODUODENOSCOPY (EGD) WITH PROPOFOL BIOPSY  Patient Location: PACU and Endoscopy Unit  Anesthesia Type:General  Level of Consciousness: drowsy and patient cooperative  Airway & Oxygen Therapy: Patient Spontanous Breathing  Post-op Assessment: Report given to RN and Post -op Vital signs reviewed and stable  Post vital signs: Reviewed and stable  Last Vitals:  Vitals Value Taken Time  BP 101/62 02/21/23 0848  Temp    Pulse 72 02/21/23 0848  Resp 24 02/21/23 0848  SpO2 96 % 02/21/23 0848    Last Pain:  Vitals:   02/21/23 0848  TempSrc:   PainSc: Asleep         Complications: No notable events documented.

## 2023-02-21 NOTE — H&P (Signed)
Outpatient short stay form Pre-procedure 02/21/2023  Regis Bill, MD  Primary Physician: Rayetta Humphrey, MD  Reason for visit:  IDA  History of present illness:    47 y/o lady with history of schizophrenia, graves with total thyroidectomy, HLD, and hypertension here for EGD/Colonoscopy for IDA. Never had these procedures before. No blood thinners. No family history of GI malignancies. No significant abdominal surgeries.    Current Facility-Administered Medications:    0.9 %  sodium chloride infusion, , Intravenous, Continuous, Stayce Delancy, Rossie Muskrat, MD, Last Rate: 20 mL/hr at 02/21/23 0818, Continued from Pre-op at 02/21/23 0818  Medications Prior to Admission  Medication Sig Dispense Refill Last Dose   amphetamine-dextroamphetamine (ADDERALL XR) 20 MG 24 hr capsule Take 20 mg by mouth daily.    02/20/2023   fluticasone (FLONASE) 50 MCG/ACT nasal spray Place into the nose.   02/20/2023   gabapentin (NEURONTIN) 100 MG capsule Take 100 mg by mouth at bedtime.    02/20/2023   haloperidol (HALDOL) 2 MG tablet Take 2 mg by mouth in the morning.    02/20/2023   hydrochlorothiazide (HYDRODIURIL) 25 MG tablet Take 25 mg by mouth daily.   02/20/2023   losartan (COZAAR) 100 MG tablet Take 100 mg by mouth daily.   02/20/2023   metFORMIN (GLUCOPHAGE) 1000 MG tablet Take 1,000 mg by mouth 2 (two) times daily with a meal.    02/20/2023   omeprazole (PRILOSEC) 20 MG capsule Take by mouth.   02/20/2023   polyethylene glycol-electrolytes (NULYTELY) 420 g solution Take 4,000 mLs by mouth once.   02/20/2023   sertraline (ZOLOFT) 100 MG tablet Take 100 mg by mouth daily.    02/20/2023   SIMPESSE 0.15-0.03 &0.01 MG tablet Take by mouth.   02/20/2023   simvastatin (ZOCOR) 40 MG tablet Take 40 mg by mouth daily.    02/20/2023   topiramate (TOPAMAX) 50 MG tablet Take 50 mg by mouth daily.    02/20/2023   VYVANSE 50 MG capsule Take 50 mg by mouth daily.   02/20/2023   albuterol (VENTOLIN HFA) 108 (90 Base) MCG/ACT  inhaler Inhale 1-2 puffs into the lungs every 6 (six) hours as needed for wheezing or shortness of breath.       calcium carbonate (OS-CAL - DOSED IN MG OF ELEMENTAL CALCIUM) 1250 (500 Ca) MG tablet Take 2 tablets (1,000 mg of elemental calcium total) by mouth 3 (three) times daily with meals. Take TWO tablets (1000mg ) THREE times daily for 1 week. After that week, take ONE tablet (500mg ) THREE times daily until your follow up appointment 120 tablet 1    glipiZIDE (GLUCOTROL XL) 5 MG 24 hr tablet Take 5 mg by mouth daily with breakfast.       ibuprofen (ADVIL) 600 MG tablet Take 1 tablet (600 mg total) by mouth every 6 (six) hours as needed (for mild pain not relieved by other medications.). 30 tablet 0    ipratropium (ATROVENT HFA) 17 MCG/ACT inhaler Inhale 1 puff into the lungs every 6 (six) hours as needed for wheezing.       ipratropium (ATROVENT) 0.02 % nebulizer solution Take by nebulization.      levothyroxine (SYNTHROID) 150 MCG tablet Take 1 tablet (150 mcg total) by mouth daily at 6 (six) AM. 30 tablet 3      No Known Allergies   Past Medical History:  Diagnosis Date   Asthma    Depression    Diabetes mellitus without complication (HCC)  Graves' disease with exophthalmos    Hypertension    Intellectual disability    NASH (nonalcoholic steatohepatitis)    Obesity     Review of systems:  Otherwise negative.    Physical Exam  Gen: Alert, oriented. Appears stated age.  HEENT: PERRLA. Lungs: No respiratory distress CV: RRR Abd: soft, benign, no masses Ext: No edema    Planned procedures: Proceed with EGD/colonoscopy. The patient understands the nature of the planned procedure, indications, risks, alternatives and potential complications including but not limited to bleeding, infection, perforation, damage to internal organs and possible oversedation/side effects from anesthesia. The patient agrees and gives consent to proceed.  Please refer to procedure notes for  findings, recommendations and patient disposition/instructions.     Regis Bill, MD [facility]

## 2023-02-21 NOTE — Op Note (Signed)
[facility] Patient Name: Jordan Sutton Procedure Date: 02/21/2023 8:16 AM MRN: [id number] Account #: [id number] Date of Birth: 1975-10-26 Admit Type: Outpatient Age: 47 Room: [facility] ENDO ROOM 3 Gender: Female Note Status: Finalized Instrument Name: Prentice Docker [id number] Procedure:             Colonoscopy Indications:           Iron deficiency anemia Providers:             Eather Colas MD, MD Medicines:             Monitored Anesthesia Care Complications:         No immediate complications. Procedure:             Pre-Anesthesia Assessment:                        - Prior to the procedure, a History and Physical was                         performed, and patient medications and allergies were                         reviewed. The patient is competent. The risks and                         benefits of the procedure and the sedation options and                         risks were discussed with the patient. All questions                         were answered and informed consent was obtained.                         Patient identification and proposed procedure were                         verified by the physician, the nurse, the                         anesthesiologist, the anesthetist and the technician                         in the endoscopy suite. Mental Status Examination:                         alert and oriented. Airway Examination: normal                         oropharyngeal airway and neck mobility. Respiratory                         Examination: clear to auscultation. CV Examination:                         normal. Prophylactic Antibiotics: The patient does not                         require prophylactic antibiotics. Prior  Anticoagulants: The patient has taken no anticoagulant                         or antiplatelet agents. ASA Grade Assessment: III - A                         patient with severe systemic disease.  After reviewing                         the risks and benefits, the patient was deemed in                         satisfactory condition to undergo the procedure. The                         anesthesia plan was to use monitored anesthesia care                         (MAC). Immediately prior to administration of                         medications, the patient was re-assessed for adequacy                         to receive sedatives. The heart rate, respiratory                         rate, oxygen saturations, blood pressure, adequacy of                         pulmonary ventilation, and response to care were                         monitored throughout the procedure. The physical                         status of the patient was re-assessed after the                         procedure.                        After obtaining informed consent, the colonoscope was                         passed under direct vision. Throughout the procedure,                         the patient's blood pressure, pulse, and oxygen                         saturations were monitored continuously. The procedure                         was aborted. The colonscope was not inserted.                         Medications were given. The colonoscopy was aborted  due to poor bowel prep with stool present. Findings:      Solid stool at rectum so decision was made to abort procedure. Impression:            - The procedure was aborted due to poor bowel prep                         with stool present.                        - No specimens collected. Recommendation:        - Discharge patient to home.                        - Resume previous diet.                        - Continue present medications.                        - Repeat colonoscopy at the next available appointment                         because the bowel preparation was suboptimal.                        - Return to referring physician as  previously                         scheduled. Diagnosis Code(s):     --- Professional ---                        D50.9, Iron deficiency anemia, unspecified Eather Colas MD, MD 02/21/2023 8:51:27 AM Number of Addenda: 0 Note Initiated On: 02/21/2023 8:16 AM Estimated Blood Loss:  Estimated blood loss: none.      [facility]

## 2023-02-21 NOTE — Anesthesia Postprocedure Evaluation (Signed)
Anesthesia Post Note  Patient: Jordan Sutton  Procedure(s) Performed: COLONOSCOPY WITH PROPOFOL ESOPHAGOGASTRODUODENOSCOPY (EGD) WITH PROPOFOL BIOPSY  Patient location during evaluation: Endoscopy Anesthesia Type: General Level of consciousness: awake and alert Pain management: pain level controlled Vital Signs Assessment: post-procedure vital signs reviewed and stable Respiratory status: spontaneous breathing, nonlabored ventilation, respiratory function stable and patient connected to nasal cannula oxygen Cardiovascular status: blood pressure returned to baseline and stable Postop Assessment: no apparent nausea or vomiting Anesthetic complications: no   No notable events documented.   Last Vitals:  Vitals:   02/21/23 0727 02/21/23 0848  BP: 133/77 101/62  Pulse: 71 72  Resp: 16 (!) 24  Temp: (!) 36 C (!) 35.5 C  SpO2: 100% 96%    Last Pain:  Vitals:   02/21/23 0848  TempSrc: Temporal  PainSc: Asleep                 Louie Boston

## 2023-02-22 ENCOUNTER — Encounter: Payer: Self-pay | Admitting: Gastroenterology

## 2023-04-24 ENCOUNTER — Encounter: Payer: Self-pay | Admitting: Emergency Medicine

## 2023-04-24 ENCOUNTER — Emergency Department
Admission: EM | Admit: 2023-04-24 | Discharge: 2023-04-24 | Disposition: A | Discharge status: Home / Self Care | Payer: Medicare HMO | Source: Home / Self Care | Attending: Emergency Medicine | Admitting: Emergency Medicine

## 2023-04-24 DIAGNOSIS — E162 Hypoglycemia, unspecified: Secondary | ICD-10-CM

## 2023-04-24 DIAGNOSIS — I10 Essential (primary) hypertension: Secondary | ICD-10-CM | POA: Insufficient documentation

## 2023-04-24 DIAGNOSIS — E11649 Type 2 diabetes mellitus with hypoglycemia without coma: Secondary | ICD-10-CM | POA: Diagnosis present

## 2023-04-24 LAB — CBG MONITORING, ED
Glucose-Capillary: 99 mg/dL (ref 70–99)
Glucose-Capillary: 72 mg/dL (ref 70–99)
Glucose-Capillary: 98 mg/dL (ref 70–99)

## 2023-04-24 MED ORDER — PREDNISONE 20 MG PO TABS
50.0000 mg | ORAL_TABLET | Freq: Once | ORAL | Status: AC
  Administered 2023-04-24: 50 mg via ORAL
  Filled 2023-04-24: qty 1

## 2023-04-24 MED ORDER — PREDNISONE 10 MG PO TABS: ORAL_TABLET | ORAL | 0 refills | Status: AC

## 2023-04-24 NOTE — ED Provider Notes (Signed)
[facility] Provider Note    Event Date/Time   First MD Initiated Contact with Patient 04/24/23 1004     (approximate)   History   Hypoglycemia   HPI  Jordan Sutton is a 47 y.o. female   Past medical history of diabetes, intellectual delay, hypertension who lives with aunt who helps with medications, found to be altered this morning and on checked blood sugar found hypoglycemic this morning.  Slept in a little bit and delayed breakfast, otherwise no changes in meds or other medical complaints.  She recently had a steroid burst that was discontinued about 48 hours ago in the setting of asthma exacerbation.  No further respiratory complaints.  Back to normal now after D10 by medics.  Information received from the patient herself as well as her aunt and caretaker at bedside.  Independent Historian contributed to assessment above: Her aunt gives collateral information as above, stating that she is on both glipizide and metformin, with no recent changes in medications fully compliant.       Physical Exam   Triage Vital Signs: ED Triage Vitals  Encounter Vitals Group     BP 04/24/23 1002 (!) 148/67     Systolic BP Percentile --      Diastolic BP Percentile --      Pulse Rate 04/24/23 1002 63     Resp 04/24/23 1002 18     Temp 04/24/23 1002 98.1 F (36.7 C)     Temp src --      SpO2 04/24/23 1002 97 %     Weight --      Height --      Head Circumference --      Peak Flow --      Pain Score 04/24/23 1001 0     Pain Loc --      Pain Education --      Exclude from Growth Chart --     Most recent vital signs: Vitals:   04/24/23 1002  BP: (!) 148/67  Pulse: 63  Resp: 18  Temp: 98.1 F (36.7 C)  SpO2: 97%    General: Awake, no distress.  CV:  Good peripheral perfusion.  Resp:  Normal effort.  Abd:  No distention.  Other:  Awake alert oriented with normal vital signs following all commands, eating.  Soft nontender abdomen.  Appears  euvolemic and comfortable.   ED Results / Procedures / Treatments   Labs (all labs ordered are listed, but only abnormal results are displayed) Labs Reviewed  CBG MONITORING, ED  CBG MONITORING, ED     I ordered and reviewed the above labs they are notable for blood glucose on recheck after dextrose given by EMS his 90s, had a couple of crackers recheck in the 70s.  PROCEDURES:  Critical Care performed: No  Procedures   MEDICATIONS ORDERED IN ED: Medications  predniSONE (DELTASONE) tablet 50 mg (50 mg Oral Given 04/24/23 1154)    IMPRESSION / MDM / ASSESSMENT AND PLAN / ED COURSE  I reviewed the triage vital signs and the nursing notes.                                Patient's presentation is most consistent with acute presentation with potential threat to life or bodily function.  Differential diagnosis includes, but is not limited to, hypoglycemia in the setting of poor p.o. intake, medication adverse effect  MDM:    Patient with hypoglycemia now resolved after given dextrose, p.o. intake, multiple rechecks in the emergency department with no acute complaints, normal mentation back to baseline per her aunt who is at bedside and her caretaker at home.  No medication changes though she slept and missed breakfast this morning which may have contributed, I wonder if her recent prednisone burst which stopped about 48 hours ago also contributed to rebound hypoglycemia?  She has no other acute medical complaints and looks well.  As long as she is tolerating p.o. and recheck CBGs are within normal limits, will defer further workup and have her discharged back at home.  Regarding the prednisone, I will start her back on and taper off and she will follow-up with PMD for further diabetic management.      FINAL CLINICAL IMPRESSION(S) / ED DIAGNOSES   Final diagnoses:  Hypoglycemia     Rx / DC Orders   ED Discharge Orders          Ordered    predniSONE (DELTASONE) 10  MG tablet  Daily        04/24/23 1133             Note:  This document was prepared using Dragon voice recognition software and may include unintentional dictation errors.    Pilar Jarvis, MD 04/24/23 [phone]

## 2023-04-24 NOTE — ED Triage Notes (Signed)
Pt here from home with c/o hypoglycemia  cbg found to be 41 at home , up to 67 on D 10 gtt , on  arrival

## 2023-04-24 NOTE — Discharge Instructions (Signed)
Your blood sugar was low.  Remember to eat your meals to keep her blood sugar at a normal level.  Take your blood sugar medications as prescribed and call your diabetes doctor for follow-up appointment this week.  Take the prednisone taper as prescribed.  Thank you for choosing [place] for your health care today!  Please see your primary doctor this week for a follow up appointment.   If you have any new, worsening, or unexpected symptoms call your doctor right away or come back to the emergency department for reevaluation.  It was my pleasure to care for you today.   Daneil Dan Modesto Charon, MD

## 2023-05-26 ENCOUNTER — Other Ambulatory Visit: Payer: Self-pay | Admitting: Family Medicine

## 2023-05-26 DIAGNOSIS — Z1231 Encounter for screening mammogram for malignant neoplasm of breast: Secondary | ICD-10-CM

## 2023-06-02 ENCOUNTER — Inpatient Hospital Stay: Payer: Medicare HMO | Admitting: Internal Medicine

## 2023-06-02 ENCOUNTER — Inpatient Hospital Stay: Payer: Medicare HMO | Attending: Internal Medicine

## 2023-06-02 ENCOUNTER — Inpatient Hospital Stay: Payer: Medicare HMO

## 2023-06-02 VITALS — BP 109/58 | HR 72 | Temp 97.9°F | Wt 209.0 lb

## 2023-06-02 DIAGNOSIS — D509 Iron deficiency anemia, unspecified: Secondary | ICD-10-CM | POA: Diagnosis present

## 2023-06-02 DIAGNOSIS — F79 Unspecified intellectual disabilities: Secondary | ICD-10-CM | POA: Insufficient documentation

## 2023-06-02 DIAGNOSIS — J45909 Unspecified asthma, uncomplicated: Secondary | ICD-10-CM | POA: Diagnosis not present

## 2023-06-02 DIAGNOSIS — E0500 Thyrotoxicosis with diffuse goiter without thyrotoxic crisis or storm: Secondary | ICD-10-CM | POA: Diagnosis not present

## 2023-06-02 DIAGNOSIS — D649 Anemia, unspecified: Secondary | ICD-10-CM

## 2023-06-02 DIAGNOSIS — N920 Excessive and frequent menstruation with regular cycle: Secondary | ICD-10-CM | POA: Insufficient documentation

## 2023-06-02 DIAGNOSIS — E119 Type 2 diabetes mellitus without complications: Secondary | ICD-10-CM | POA: Diagnosis not present

## 2023-06-02 DIAGNOSIS — D508 Other iron deficiency anemias: Secondary | ICD-10-CM

## 2023-06-02 DIAGNOSIS — K449 Diaphragmatic hernia without obstruction or gangrene: Secondary | ICD-10-CM | POA: Insufficient documentation

## 2023-06-02 LAB — CBC WITH DIFFERENTIAL/PLATELET
Abs Immature Granulocytes: 0.03 10*3/uL (ref 0.00–0.07)
Basophils Absolute: 0.1 10*3/uL (ref 0.0–0.1)
Basophils Relative: 1 %
Eosinophils Absolute: 0.5 10*3/uL (ref 0.0–0.5)
Eosinophils Relative: 8 %
HCT: 39.4 % (ref 36.0–46.0)
Hemoglobin: 13 g/dL (ref 12.0–15.0)
Immature Granulocytes: 0 %
Lymphocytes Relative: 22 %
Lymphs Abs: 1.6 10*3/uL (ref 0.7–4.0)
MCH: 29.2 pg (ref 26.0–34.0)
MCHC: 33 g/dL (ref 30.0–36.0)
MCV: 88.5 fL (ref 80.0–100.0)
Monocytes Absolute: 0.5 10*3/uL (ref 0.1–1.0)
Monocytes Relative: 7 %
Neutro Abs: 4.3 10*3/uL (ref 1.7–7.7)
Neutrophils Relative %: 62 %
Platelets: 372 10*3/uL (ref 150–400)
RBC: 4.45 MIL/uL (ref 3.87–5.11)
RDW: 12.9 % (ref 11.5–15.5)
WBC: 7 10*3/uL (ref 4.0–10.5)
nRBC: 0 % (ref 0.0–0.2)

## 2023-06-02 LAB — IRON AND TIBC
Iron: 70 ug/dL (ref 28–170)
Saturation Ratios: 19 % (ref 10.4–31.8)
TIBC: 375 ug/dL (ref 250–450)
UIBC: 305 ug/dL

## 2023-06-02 LAB — FERRITIN: Ferritin: 16 ng/mL (ref 11–307)

## 2023-06-02 LAB — VITAMIN B12: Vitamin B-12: 210 pg/mL (ref 180–914)

## 2023-06-02 NOTE — Progress Notes (Addendum)
[facility]  [phone] [phone]  ID: Jordan Sutton OB: 01-Aug-1976  MR#: [id number]  EXB#:[id number]  Patient Care Team: Rayetta Humphrey, MD as PCP - General (Family Medicine) Michaelyn Barter, MD as Consulting Physician (Oncology)   HPI: Jordan Sutton is a 47 y.o. female with past medical history of ADD, diabetes, asthma, depression, Graves' disease was referred to hematology for iron deficiency anemia  Patient accompanied today with her and who provided most of the information since patient has intellectual disability.  She follows with Dr. Greggory Stallion.  Routine lab work showed hemoglobin of 8.5, MCV 77, WBC and platelets normal.  She has been feeling well.  Denies any NSAIDs or gastric bypass surgery.  Not sure if there is any blood in stools or urine or black tarry stools. Has heavy menstrual period.   Completed 5 infusions of IV Venofer in February 2024. Endoscopy by Dr. Mia Creek in July 2024 showed small hiatal hernia.  Colonoscopy cannot be completed due to suboptimal prep.  Now rescheduled for January 2025.  Interval history- Patient was seen today accompanied by caregiver for follow-up labs for iron deficiency anemia. Continues to feel well.  Has intermittent shortness of breath on exertion from her asthma.  Denies any bleeding in urine or stools.  REVIEW OF SYSTEMS:   Review of Systems  Respiratory:  Positive for shortness of breath.     As per HPI. Otherwise, a complete review of systems is negative.  PAST MEDICAL HISTORY: Past Medical History:  Diagnosis Date   Asthma    Depression    Diabetes mellitus without complication (HCC)    Graves' disease with exophthalmos    Hypertension    Intellectual disability    NASH (nonalcoholic steatohepatitis)    Obesity     PAST SURGICAL HISTORY: Past Surgical History:  Procedure Laterality Date   BIOPSY  02/21/2023   Procedure: BIOPSY;  Surgeon: Regis Bill, MD;  Location: [facility]  ENDOSCOPY;  Service: Endoscopy;;   COLONOSCOPY WITH PROPOFOL N/A 02/21/2023   Procedure: COLONOSCOPY WITH PROPOFOL;  Surgeon: Regis Bill, MD;  Location: [facility] ENDOSCOPY;  Service: Endoscopy;  Laterality: N/A;   ESOPHAGOGASTRODUODENOSCOPY (EGD) WITH PROPOFOL N/A 02/21/2023   Procedure: ESOPHAGOGASTRODUODENOSCOPY (EGD) WITH PROPOFOL;  Surgeon: Regis Bill, MD;  Location: [facility] ENDOSCOPY;  Service: Endoscopy;  Laterality: N/A;   THYROIDECTOMY N/A 06/23/2020   Procedure: THYROIDECTOMY, total;  Surgeon: Duanne Guess, MD;  Location: [facility] ORS;  Service: General;  Laterality: N/A;    FAMILY HISTORY: Family History  Problem Relation Age of Onset   Alcohol abuse Mother    Hypertension Father     HEALTH MAINTENANCE: Social History   Tobacco Use   Smoking status: Never   Smokeless tobacco: Never  Vaping Use   Vaping status: Never Used  Substance Use Topics   Alcohol use: Not Currently   Drug use: Never     No Known Allergies  Current Outpatient Medications  Medication Sig Dispense Refill   albuterol (VENTOLIN HFA) 108 (90 Base) MCG/ACT inhaler Inhale 1-2 puffs into the lungs every 6 (six) hours as needed for wheezing or shortness of breath.      amphetamine-dextroamphetamine (ADDERALL XR) 20 MG 24 hr capsule Take 20 mg by mouth daily.      calcium carbonate (OS-CAL - DOSED IN MG OF ELEMENTAL CALCIUM) 1250 (500 Ca) MG tablet Take 2 tablets (1,000 mg of elemental calcium total) by mouth 3 (three) times daily with meals. Take TWO tablets (1000mg ) THREE times daily  for 1 week. After that week, take ONE tablet (500mg ) THREE times daily until your follow up appointment 120 tablet 1   fluticasone (FLONASE) 50 MCG/ACT nasal spray Place into the nose.     gabapentin (NEURONTIN) 100 MG capsule Take 100 mg by mouth at bedtime.      glipiZIDE (GLUCOTROL XL) 5 MG 24 hr tablet Take 5 mg by mouth daily with breakfast.      haloperidol (HALDOL) 2 MG tablet Take 2 mg by mouth in the morning.       hydrochlorothiazide (HYDRODIURIL) 25 MG tablet Take 25 mg by mouth daily.     ibuprofen (ADVIL) 600 MG tablet Take 1 tablet (600 mg total) by mouth every 6 (six) hours as needed (for mild pain not relieved by other medications.). 30 tablet 0   ipratropium (ATROVENT HFA) 17 MCG/ACT inhaler Inhale 1 puff into the lungs every 6 (six) hours as needed for wheezing.      ipratropium (ATROVENT) 0.02 % nebulizer solution Take by nebulization.     levothyroxine (SYNTHROID) 150 MCG tablet Take 1 tablet (150 mcg total) by mouth daily at 6 (six) AM. 30 tablet 3   losartan (COZAAR) 100 MG tablet Take 100 mg by mouth daily.     metFORMIN (GLUCOPHAGE) 1000 MG tablet Take 1,000 mg by mouth 2 (two) times daily with a meal.      omeprazole (PRILOSEC) 20 MG capsule Take by mouth.     polyethylene glycol-electrolytes (NULYTELY) 420 g solution Take 4,000 mLs by mouth once.     sertraline (ZOLOFT) 100 MG tablet Take 100 mg by mouth daily.      SIMPESSE 0.15-0.03 &0.01 MG tablet Take by mouth.     simvastatin (ZOCOR) 40 MG tablet Take 40 mg by mouth daily.      topiramate (TOPAMAX) 50 MG tablet Take 50 mg by mouth daily.      VYVANSE 50 MG capsule Take 50 mg by mouth daily.     No current facility-administered medications for this visit.    OBJECTIVE: Vitals:   06/02/23 1331  BP: (!) 109/58  Pulse: 72  Temp: 97.9 F (36.6 C)  SpO2: 100%     Body mass index is 32.73 kg/m.      General: Well-developed, well-nourished, no acute distress. Eyes: Pink conjunctiva, anicteric sclera. HEENT: Normocephalic, moist mucous membranes, clear oropharnyx. Lungs: Clear to auscultation bilaterally. Heart: Regular rate and rhythm. No rubs, murmurs, or gallops. Abdomen: Soft, nontender, nondistended. No organomegaly noted, normoactive bowel sounds. Musculoskeletal: No edema, cyanosis, or clubbing. Neuro: Alert, answering all questions appropriately. Cranial nerves grossly intact. Skin: No rashes or petechiae  noted. Psych: Normal affect. Lymphatics: No cervical, calvicular, axillary or inguinal LAD.   LAB RESULTS:  Lab Results  Component Value Date   NA 138 06/19/2020   K 4.2 06/19/2020   CL 104 06/19/2020   CO2 26 06/19/2020   GLUCOSE 148 (H) 06/19/2020   BUN 9 06/19/2020   CREATININE 0.74 06/19/2020   CALCIUM 9.1 06/24/2020   ALBUMIN 3.4 (L) 06/24/2020   GFRNONAA >60 06/19/2020   GFRAA >60 01/30/2020    Lab Results  Component Value Date   WBC 7.0 06/02/2023   NEUTROABS 4.3 06/02/2023   HGB 13.0 06/02/2023   HCT 39.4 06/02/2023   MCV 88.5 06/02/2023   PLT 372 06/02/2023    Lab Results  Component Value Date   TIBC 375 06/02/2023   TIBC 344 11/29/2022   TIBC 529 (H) 08/24/2022   FERRITIN  16 06/02/2023   FERRITIN 36 11/29/2022   FERRITIN 5 (L) 08/24/2022   IRONPCTSAT 19 06/02/2023   IRONPCTSAT 26 11/29/2022   IRONPCTSAT 4 (L) 08/24/2022     STUDIES: No results found.  ASSESSMENT AND PLAN:   Jordan Sutton is a 47 y.o. female with pmh of ADD, diabetes, asthma, depression, Graves' disease was referred to hematology for iron deficiency anemia  # Iron deficiency anemia -Of unknown cause.  Endoscopy by Dr. Mia Creek in July 2024 showed small hiatal hernia.  Colonoscopy could not be completed due to suboptimal prep.  Rescheduled in January 2025.   -Completed IV Venofer 200 mg x 5 doses in February 2024.  Labs today showed normal hemoglobin of 13.  Iron panel is normal.  She has been feeling well overall.  I discussed about transferring the care to primary Dr. Greggory Stallion who can monitor the iron level.  If her levels drop again, she can inform us to arrange for iron infusions.  Caregiver informed.  They are comfortable with the plan.  #Borderline low b12 - advised to start oral b12 1000 mcg once daily OTC.  - Repeat pending from today.   No orders of the defined types were placed in this encounter.  RTC as needed  Patient expressed understanding and was in agreement with  this plan. She also understands that She can call clinic at any time with any questions, concerns, or complaints.   I spent a total of 25 minutes reviewing chart data, face-to-face evaluation with the patient, counseling and coordination of care as detailed above.  Michaelyn Barter, MD   06/02/2023 3:13 PM

## 2023-06-02 NOTE — Progress Notes (Signed)
Patient has been having some shortness of breath here recently. Other than that she really don't have any new questions or concerns for the doctor today.

## 2023-06-02 NOTE — Patient Instructions (Signed)
Can start vitamin b12 pill 1000 mcg once daily. Available over the counter.

## 2023-07-29 ENCOUNTER — Encounter: Payer: Self-pay | Admitting: *Deleted

## 2023-08-30 ENCOUNTER — Ambulatory Visit: Admission: RE | Admit: 2023-08-30 | Payer: Medicare HMO | Source: Home / Self Care

## 2023-08-30 SURGERY — COLONOSCOPY WITH PROPOFOL
Anesthesia: General

## 2023-09-12 ENCOUNTER — Encounter: Payer: Self-pay | Admitting: Internal Medicine

## 2024-02-08 ENCOUNTER — Other Ambulatory Visit: Payer: Self-pay | Admitting: Family Medicine

## 2024-02-08 DIAGNOSIS — Z1231 Encounter for screening mammogram for malignant neoplasm of breast: Secondary | ICD-10-CM

## 2024-03-27 ENCOUNTER — Encounter: Payer: Self-pay | Admitting: Internal Medicine

## 2024-04-03 ENCOUNTER — Ambulatory Visit
Admission: RE | Admit: 2024-04-03 | Discharge: 2024-04-03 | Disposition: A | Discharge status: Home / Self Care | Source: Ambulatory Visit | Attending: Family Medicine | Admitting: Family Medicine

## 2024-04-03 DIAGNOSIS — Z1231 Encounter for screening mammogram for malignant neoplasm of breast: Secondary | ICD-10-CM | POA: Insufficient documentation
